# Patient Record
Sex: Male | Born: 1965 | Race: Black or African American | Hispanic: No | Marital: Single
Health system: Midwestern US, Community
[De-identification: ages and names within clinical notes are randomized; demographics above are authoritative.]

## PROBLEM LIST (undated history)

## (undated) DIAGNOSIS — I429 Cardiomyopathy, unspecified: Secondary | ICD-10-CM

## (undated) DIAGNOSIS — G4730 Sleep apnea, unspecified: Secondary | ICD-10-CM

## (undated) DIAGNOSIS — I482 Chronic atrial fibrillation, unspecified: Secondary | ICD-10-CM

## (undated) DIAGNOSIS — Z95810 Presence of automatic (implantable) cardiac defibrillator: Secondary | ICD-10-CM

## (undated) DIAGNOSIS — Z9119 Patient's noncompliance with other medical treatment and regimen: Secondary | ICD-10-CM

## (undated) DIAGNOSIS — D509 Iron deficiency anemia, unspecified: Secondary | ICD-10-CM

## (undated) DIAGNOSIS — Z91199 Patient's noncompliance with other medical treatment and regimen due to unspecified reason: Secondary | ICD-10-CM

## (undated) DIAGNOSIS — I5022 Chronic systolic (congestive) heart failure: Secondary | ICD-10-CM

## (undated) DIAGNOSIS — K7460 Unspecified cirrhosis of liver: Secondary | ICD-10-CM

## (undated) DIAGNOSIS — I10 Essential (primary) hypertension: Secondary | ICD-10-CM

## (undated) DIAGNOSIS — I219 Acute myocardial infarction, unspecified: Secondary | ICD-10-CM

## (undated) DIAGNOSIS — I2510 Atherosclerotic heart disease of native coronary artery without angina pectoris: Secondary | ICD-10-CM

## (undated) HISTORY — PX: TONSILLECTOMY: SUR1361

## (undated) HISTORY — PX: EP IMPLANTABLE DEVICE: SHX172B

---

## 2011-11-27 NOTE — ED Notes (Signed)
Pt brought to room by triage nurse

## 2011-11-28 LAB — METABOLIC PANEL, COMPREHENSIVE
A-G Ratio: 0.9 (ref 0.8–1.7)
ALT (SGPT): 30 U/L (ref 12.0–78.0)
AST (SGOT): 40 U/L — ABNORMAL HIGH (ref 15–37)
Albumin: 3.5 g/dL (ref 3.4–5.0)
Alk. phosphatase: 74 U/L (ref 50–136)
Anion gap: 10 mmol/L (ref 3.0–18)
BUN/Creatinine ratio: 14 (ref 12–20)
BUN: 18 MG/DL (ref 7.0–18)
Bilirubin, total: 2.4 MG/DL — ABNORMAL HIGH (ref 0.2–1.0)
CO2: 28 MMOL/L (ref 21–32)
Calcium: 8.5 MG/DL (ref 8.5–10.1)
Chloride: 104 MMOL/L (ref 100–108)
Creatinine: 1.3 MG/DL (ref 0.6–1.3)
GFR est AA: >60 mL/min/{1.73_m2} (ref >60)
GFR est non-AA: >60 mL/min/{1.73_m2} (ref >60)
Globulin: 3.7 g/dL (ref 2.0–4.0)
Glucose: 89 MG/DL (ref 74–99)
Potassium: 2.9 MMOL/L — CL (ref 3.5–5.5)
Protein, total: 7.2 g/dL (ref 6.4–8.2)
Sodium: 142 MMOL/L (ref 136–145)

## 2011-11-28 LAB — ETHYL ALCOHOL: ALCOHOL(ETHYL),SERUM: 3 MG/DL (ref 0–3)

## 2011-11-28 LAB — CBC WITH AUTOMATED DIFF
ABS. BASOPHILS: 0.1 10*3/uL — ABNORMAL HIGH (ref 0.0–0.06)
ABS. EOSINOPHILS: 0.1 10*3/uL (ref 0.0–0.4)
ABS. LYMPHOCYTES: 2.3 10*3/uL (ref 0.9–3.6)
ABS. MONOCYTES: 0.8 10*3/uL (ref 0.05–1.2)
ABS. NEUTROPHILS: 4.7 10*3/uL (ref 1.8–8.0)
BASOPHILS: 1 % (ref 0–2)
EOSINOPHILS: 1 % (ref 0–5)
HCT: 35.3 % — ABNORMAL LOW (ref 36.0–48.0)
HGB: 11.2 g/dL — ABNORMAL LOW (ref 13.0–16.0)
LYMPHOCYTES: 29 % (ref 21–52)
MCH: 26.8 PG (ref 24.0–34.0)
MCHC: 31.7 g/dL (ref 31.0–37.0)
MCV: 84.4 FL (ref 74.0–97.0)
MONOCYTES: 9 % (ref 3–10)
MPV: 9.9 FL (ref 9.2–11.8)
NEUTROPHILS: 60 % (ref 40–73)
PLATELET: 252 10*3/uL (ref 135–420)
RBC: 4.18 M/uL — ABNORMAL LOW (ref 4.70–5.50)
RDW: 16.9 % — ABNORMAL HIGH (ref 11.6–14.5)
WBC: 8 10*3/uL (ref 4.6–13.2)

## 2011-11-28 LAB — NT-PRO BNP: NT pro-BNP: 7047 PG/ML — ABNORMAL HIGH (ref 0–450)

## 2011-11-28 LAB — DRUG SCREEN, URINE (HBV ONLY)
AMPHETAMINES: NEGATIVE
BARBITURATES: NEGATIVE
BENZODIAZEPINES: NEGATIVE
COCAINE: NEGATIVE
OPIATES: NEGATIVE
PCP(PHENCYCLIDINE): NEGATIVE
THC (TH-CANNABINOL): POSITIVE — AB
TRICYCLICS: NEGATIVE

## 2011-11-28 LAB — EKG, 12 LEAD, INITIAL
Calculated R Axis: 14 degrees
Calculated T Axis: -119 degrees
Q-T Interval: 364 ms
QRS Duration: 136 ms
QTC Calculation (Bezet): 450 ms
Ventricular Rate: 92 {beats}/min

## 2011-11-28 LAB — CARDIAC PANEL,(CK, CKMB & TROPONIN)
CK - MB: 1.5 ng/ml (ref 0.5–3.6)
CK-MB Index: 0.3 % (ref 0.0–4.0)
CK: 519 U/L — ABNORMAL HIGH (ref 39–308)
Troponin-I, QT: 0.07 NG/ML — ABNORMAL HIGH (ref 0.00–0.06)

## 2011-11-28 LAB — DIGOXIN: Digoxin level: 0.2 NG/ML — ABNORMAL LOW (ref 0.9–2.0)

## 2011-11-28 LAB — MAGNESIUM: Magnesium: 1.9 MG/DL (ref 1.8–2.4)

## 2011-11-28 MED ORDER — CLONIDINE 0.1 MG TAB
0.1 mg | ORAL | Status: AC
Start: 2011-11-28 — End: 2011-11-28
  Administered 2011-11-28: 08:00:00 via ORAL

## 2011-11-28 MED ORDER — DIGOXIN 250 MCG/ML IJ SOLN
250 mcg/mL (0.25 mg/mL) | INTRAMUSCULAR | Status: AC
Start: 2011-11-28 — End: 2011-11-28
  Administered 2011-11-28: 08:00:00 via INTRAVENOUS

## 2011-11-28 MED ORDER — POTASSIUM CHLORIDE SR 20 MEQ TAB, PARTICLES/CRYSTALS
20 mEq | ORAL | Status: AC
Start: 2011-11-28 — End: 2011-11-28
  Administered 2011-11-28: 08:00:00 via ORAL

## 2011-11-28 MED ORDER — MAGNESIUM SULFATE 50 % (4 MEQ/ML) INJECTION
4 mEq/mL (50 %) | INTRAMUSCULAR | Status: AC
Start: 2011-11-28 — End: 2011-11-28
  Administered 2011-11-28: 08:00:00 via INTRAVENOUS

## 2011-11-28 MED FILL — MAGNESIUM SULFATE 50 % (4 MEQ/ML) INJECTION: 4 mEq/mL (50 %) | INTRAMUSCULAR | Qty: 4

## 2011-11-28 MED FILL — CLONIDINE 0.1 MG TAB: 0.1 mg | ORAL | Qty: 2

## 2011-11-28 MED FILL — POTASSIUM CHLORIDE SR 20 MEQ TAB, PARTICLES/CRYSTALS: 20 mEq | ORAL | Qty: 2

## 2011-11-28 MED FILL — DIGOXIN 250 MCG/ML IJ SOLN: 250 mcg/mL (0.25 mg/mL) | INTRAMUSCULAR | Qty: 2

## 2011-11-28 NOTE — ED Notes (Signed)
Potassium 2.9; Dr Clinton Sawyer notified

## 2011-11-28 NOTE — ED Notes (Signed)
[employer] are coming out to interrogate the pacemaker

## 2011-11-28 NOTE — ED Notes (Signed)
Pt is from out of town and is visiting family when his pacemaker went off. Pt states he had smoked some weed and drank some alcohol

## 2011-11-28 NOTE — ED Notes (Signed)
I have reviewed discharge instructions with the patient.  The patient verbalized understanding.  Patient armband removed and shredded

## 2011-11-28 NOTE — ED Notes (Signed)
Report to Kelly

## 2011-11-28 NOTE — ED Notes (Signed)
[employer] is at bedside

## 2011-11-28 NOTE — ED Provider Notes (Addendum)
HPI Comments: Lance Lawrence is a 46 y.o. male with Hx of hyperchloremia, HTN, heart failure and CAD presents to the ED for evaluation. Patient notes that he was with a friend smoking weed and drinking alcohol then went to lay down.  Patient states that it then felt like something "suddenly hit me and it hurt form my chest up into my head."  Patient admits that he has a cough currently as well.  His cardiologist is Dr. Cliffton Asters in [place].  Patient is unsure of if his pacemaker "fired."  No further symptoms or complaints were expressed at this time.      Patient is a 46 y.o. male presenting with chest pain and pacemaker problem. The history is provided by the patient.   Chest Pain (Angina)   Pertinent negatives include no abdominal pain, no back pain, no cough, no diaphoresis, no fever, no headaches, no nausea, no numbness, no shortness of breath, no vomiting and no weakness.   Pacemaker Problem   Pertinent negatives include no abdominal pain, no back pain, no cough, no diaphoresis, no fever, no headaches, no nausea, no numbness, no shortness of breath, no vomiting and no weakness.        Past Medical History   Diagnosis Date   ??? CAD (coronary artery disease)    ??? Heart failure    ??? Hypertension    ??? Hyperchloremia         Past Surgical History   Procedure Date   ??? Hx pacemaker          No family history on file.     History     Social History   ??? Marital Status: SINGLE     Spouse Name: N/A     Number of Children: N/A   ??? Years of Education: N/A     Occupational History   ??? Not on file.     Social History Main Topics   ??? Smoking status: Not on file   ??? Smokeless tobacco: Not on file   ??? Alcohol Use: Yes   ??? Drug Use: Yes   ??? Sexually Active:      Other Topics Concern   ??? Not on file     Social History Narrative   ??? No narrative on file                  ALLERGIES: Review of patient's allergies indicates no known allergies.      Review of Systems   Constitutional: Negative.  Negative for fever, chills and  diaphoresis.   HENT: Negative.  Negative for congestion, sore throat, rhinorrhea and neck pain.    Eyes: Negative.  Negative for pain, discharge and redness.   Respiratory: Negative.  Negative for cough, chest tightness, shortness of breath and wheezing.    Cardiovascular: Positive for chest pain.   Gastrointestinal: Negative.  Negative for nausea, vomiting, abdominal pain, diarrhea and constipation.   Genitourinary: Negative.  Negative for dysuria, urgency, frequency, hematuria and flank pain.   Musculoskeletal: Negative.  Negative for back pain.   Skin: Negative.  Negative for rash.   Neurological: Negative.  Negative for syncope, weakness, numbness and headaches.   Hematological: Negative.    Psychiatric/Behavioral: Negative.    All other systems reviewed and are negative.        Filed Vitals:    11/28/11 0006 11/28/11 0015   BP:  141/81   Pulse:  79   Temp: 99.5 ??F (37.5 ??C)  Resp:  32   Height: 5\' 11"  (1.803 m)    Weight: 97.523 kg (215 lb)    SpO2: 100% 92%            Physical Exam   Nursing note and vitals reviewed.  Constitutional: He is oriented to person, place, and time. He appears well-developed and well-nourished.   HENT:   Head: Normocephalic and atraumatic.   Eyes: Conjunctivae and EOM are normal. Pupils are equal, round, and reactive to light. Right eye exhibits no discharge. Left eye exhibits no discharge. No scleral icterus.   Neck: Normal range of motion. Neck supple. No JVD present. No tracheal deviation present. No thyromegaly present.   Cardiovascular: Normal rate, regular rhythm and normal heart sounds.  Exam reveals no gallop and no friction rub.    No murmur heard.  Pulmonary/Chest: Effort normal and breath sounds normal. No stridor. No respiratory distress. He has no wheezes. He has no rales. He exhibits no tenderness.   Abdominal: Soft. Bowel sounds are normal. He exhibits no distension and no mass. There is no tenderness. There is no rebound and no guarding.   Musculoskeletal: Normal  range of motion. He exhibits no edema and no tenderness.   Lymphadenopathy:     He has no cervical adenopathy.   Neurological: He is alert and oriented to person, place, and time. He displays normal reflexes. No cranial nerve deficit. He exhibits normal muscle tone. Coordination normal.   Skin: Skin is warm and dry. No rash noted. No erythema. No pallor.   Psychiatric: He has a normal mood and affect. His behavior is normal. Judgment and thought content normal.        MDM     Differential Diagnosis; Clinical Impression; Plan:     Pt has extensive cardiac disease, states he is compliant with meds however digoxin level is not therapeutic, I got pacemaker interrogated, it showed increase rate but no definitve VT or VF, had the interrogator discuss this with cardiology pa who suggested adjusting rate, and discharge home. I will replace the electrolytes and give digoxin plus antihypertensive medication. Pt is in no distress. Will discharge to [facility] until patient goes back home.       Procedures    CONSULTATIONS:  12:38 AM: Discussed care with [employer].  Standard discussion; including history of patient???s chief complaint, available diagnostic results, and treatment course. Patient is not in their system.  1:16 AM: Patient is under [employer]'s system.       SCRIBE ATTESTATION STATEMENT:  Provider documentation is written by Marnette Burgess, acting as a scribe for Jerre Simon, MD.  I have reviewed the information recorded by the scribe and agree with its contents: Jerre Simon, MD.

## 2011-11-28 NOTE — ED Notes (Signed)
Pt resting comfortably. Informed about [employer] coming to interrogate ICD

## 2013-11-08 NOTE — ED Provider Notes (Signed)
HPI Comments: Lance RamsayDavid Lawrence is a 48 y.o. male with a Hx of HTN, CAD, and CHF presents to the ED with complaints of acute on chronic dyspnea, lower extremity swelling, abd distension, and substernal CP described as a pressure. Pt is a rambling historian. Pt has a family member admitted to [facility], and he was asleep in the waiting room when he had sudden onset of acute dyspnea and substernal CP. He reports being seen at [facility] for dyspnea yesterday and discharged. He has not taken prescribed medications in 2 days. He has a pacemaker that he says was placed by [facility] in [place], [place]. He does not know the results of stress tests performed, and says a cardiac catheterization showed "a little blockage???they didn't say anything about stents." Pt also notes a cough for the past few days. Pt made no further complaints.    The history is provided by the patient and medical records. No language interpreter was used.        Past Medical History   Diagnosis Date   ??? CAD (coronary artery disease)    ??? Heart failure    ??? Hypertension    ??? Hyperchloremia         Past Surgical History   Procedure Laterality Date   ??? Hx pacemaker           History reviewed. No pertinent family history.     History     Social History   ??? Marital Status: SINGLE     Spouse Name: N/A     Number of Children: N/A   ??? Years of Education: N/A     Occupational History   ??? Not on file.     Social History Main Topics   ??? Smoking status: Not on file   ??? Smokeless tobacco: Not on file   ??? Alcohol Use: Yes   ??? Drug Use: Yes   ??? Sexual Activity: Not on file     Other Topics Concern   ??? Not on file     Social History Narrative                  ALLERGIES: Review of patient's allergies indicates no known allergies.      Review of Systems   Constitutional: Negative.    HENT: Negative.    Eyes: Negative.    Respiratory: Positive for cough and shortness of breath.    Cardiovascular: Positive for chest pain and leg swelling.   Gastrointestinal: Positive for  abdominal distention.   Endocrine: Negative.    Genitourinary: Negative.    Musculoskeletal: Negative.    Skin: Negative.    Allergic/Immunologic: Negative.    Neurological: Negative.    Hematological: Negative.    Psychiatric/Behavioral: Negative.    All other systems reviewed and are negative.      Filed Vitals:    11/08/13 2230 11/09/13 0015 11/09/13 0100 11/09/13 0215   BP: 140/97 130/89 126/91 114/75   Pulse: 89 87 82 76   Resp: 24 21 18 18    SpO2: 95% 94% 95% 96%            Physical Exam   Constitutional: He is oriented to person, place, and time. He appears well-developed and well-nourished. No distress.   HENT:   Head: Normocephalic and atraumatic.   Right Ear: External ear normal.   Left Ear: External ear normal.   Nose: Nose normal.   Mouth/Throat: Oropharynx is clear and moist.   Eyes: Conjunctivae and EOM  are normal. Pupils are equal, round, and reactive to light. Scleral icterus is present.   Neck: Normal range of motion. Neck supple. JVD present. No tracheal deviation present. No thyromegaly present.   JVD to the angle of the jaw at 45??.    Cardiovascular: Normal heart sounds and intact distal pulses.  Exam reveals no gallop and no friction rub.    No murmur heard.  Irregularly irregular.    Pulmonary/Chest: He has wheezes. He exhibits no tenderness.   Heartbeat felt at the 6th intercostal at the mid-axillary line. Trace wheezing and tachypneic.    Abdominal: Soft. Bowel sounds are normal. He exhibits no distension. There is no tenderness. There is no rebound and no guarding.   Musculoskeletal: Normal range of motion. He exhibits edema. He exhibits no tenderness.   3+ lower extremity edema up to the pre-sacrum.    Lymphadenopathy:     He has no cervical adenopathy.   Neurological: He is alert and oriented to person, place, and time. He has normal reflexes. No cranial nerve deficit. Coordination normal.   No sensory loss, Gait normal, Motor 5/5   Skin: Skin is warm and dry.   Psychiatric: He has a  normal mood and affect. His behavior is normal. Judgment and thought content normal.   Nursing note and vitals reviewed.       MDM  Number of Diagnoses or Management Options  Diagnosis management comments: Shortness of breath noted. DDx CHF, COPD, clot, infection, hemorrhage, other etiologies.  Chest pain, differential to include coronary artery disease related,pericardial disease, vascular disease, PE, esophageal or gastric conditions, gallbladder disease,musculoskeletal abnormalities,other possible etiologies.  History of cardiomyopathy with a defibrillator placed at [facility] in [place]. States he also had a cardiac cath there showing a coronary blockage Will attempt to obtain the old records.   Reviewed [facility] records, results as followsDiagnosis Date   ??? Ischemic cardiomyopathy 08/14/04   Ef=30%   ??? HLD (hyperlipidemia)   ??? Tobacco abuse   ??? PUD (peptic ulcer disease)   ??? Marijuana abuse   ??? Noncompliance with medication regimen   ??? Alcohol abuse   ??? CAD (coronary artery disease) 08/14/04   Angioplasty x 1 vessel 11/05, Cath [id number] 20% LCx & RCA   ??? Hypertension   ??? Systolic and diastolic CHF, acute on chronic   June 2012 EF = 15% by echo   ??? Pacemaker 2011   ??? Congestive heart failure, unspecified   CHF book given 11/18/2012, pt has scales 05/25/2013 Digital scales provided and usage reviewed. Provided CHF zones and weight logs   ??? Atrial fibrillation   ??? Atrial thrombus   ??? Cirrhosis of liver   ??? Cannabinosis     Past Surgical History   Procedure Laterality Date   ??? Sur - tonsillectomy   ??? Cpt - left heart catheterization,retrograde,brachial/axillary/femoral artery;percutaneous 08/14/04   PTCA left circumflex of OM3   ??? Biventricular icd   ??? Biventricular pacemaker     History     Social History   ??? Marital Status: Legally Separated   Spouse Name: N/A   Number of Children: 4   ??? Years of Education: N/A     Occupational History   ??? Not on file.     Social History Main Topics   ??? Smoking status:  Current Every Day Smoker -- 0.50 packs/day for 30 years   Types: Cigarettes   ??? Smokeless tobacco: Never Used   ??? Alcohol Use: No   ???  Drug Use: 6.00 per week   Special: Smoke   Comment: marijuana   ??? Sexual Activity: Yes   Partners: Female     Other Topics Concern   ??? Hearing Deficit No   ??? Communication Concerns No   ??? Mobility Deficits No   ??? Vision Deficits No   ??? Memory Deficits No   ??? Self Care Deficits No   ??? Swallowing Deficits No   ??? Military Service No   ??? Seat Belt Yes   ??? Stress Concerns Yes   ??? Weight Concerns No   ??? Blood Transfusions No   ??? Equipment/Assistive Technology No     Social History Narrative   ??? No narrative on file     Family History   Problem Relation Age of Onset   ??? CAD Father   ??? Diabetes Father   ??? Diabetes Mother   ??? Diabetes Brother   ??? Heart Brother   ??? Hypertension Brother   ??? Diabetes Sister   ??? Heart Brother   ??? Hypertension Brother          Amount and/or Complexity of Data Reviewed  Clinical lab tests: ordered and reviewed  Tests in the radiology section of CPT??: ordered        Procedures    Patient Vitals for the past 12 hrs:   Pulse Resp BP SpO2   11/09/13 0215 76 18 114/75 mmHg 96 %   11/09/13 0100 82 18 126/91 mmHg 95 %   11/09/13 0015 87 21 130/89 mmHg 94 %   11/08/13 2230 89 24 140/97 mmHg 95 %   11/08/13 2152 75 22 131/97 mmHg 98 %         Medications ordered:   Medications   atorvastatin (LIPITOR) tablet 10 mg (10 mg Oral Given 11/09/13 0009)   albuterol-ipratropium (DUO-NEB) 2.5 MG-0.5 MG/3 ML (3 mL Nebulization Given 11/08/13 2222)   furosemide (LASIX) injection 40 mg (40 mg IntraVENous Given 11/09/13 0011)   carvedilol (COREG) tablet 6.25 mg (6.25 mg Oral Given 11/09/13 0011)   rivaroxaban (XARELTO) tablet 20 mg (20 mg Oral Given 11/09/13 0113)         Lab findings:  Recent Results (from the past 12 hour(s))   METABOLIC PANEL, BASIC    Collection Time     11/08/13  9:51 PM       Result Value Ref Range    Sodium 140  136 - 145 mmol/L    Potassium 3.3 (*) 3.5 - 5.5 mmol/L     Chloride 106  100 - 108 mmol/L    CO2 27  21 - 32 mmol/L    Anion gap 7  3.0 - 18 mmol/L    Glucose 87  74 - 99 mg/dL    BUN 14  7.0 - 18 MG/DL    Creatinine 1.61  0.6 - 1.3 MG/DL    BUN/Creatinine ratio 13  12 - 20      GFR est AA >60  >60 ml/min/1.57m2    GFR est non-AA >60  >60 ml/min/1.92m2    Calcium 8.7  8.5 - 10.1 MG/DL   CARDIAC PANEL,(CK, CKMB & TROPONIN)    Collection Time     11/08/13  9:51 PM       Result Value Ref Range    CK 139  39 - 308 U/L    CK - MB 0.7  0.5 - 3.6 ng/ml    CK-MB Index 0.5  0.0 - 4.0 %  Troponin-I, Qt. 0.02  0.0 - 0.045 NG/ML   PRO-BNP    Collection Time     11/08/13  9:51 PM       Result Value Ref Range    NT pro-BNP 5895 (*) 0 - 450 PG/ML   DIGOXIN    Collection Time     11/08/13  9:55 PM       Result Value Ref Range    DIGOXIN 0.3 (*) 0.9 - 2.0 NG/ML   CARDIAC PANEL,(CK, CKMB & TROPONIN)    Collection Time     11/09/13  1:54 AM       Result Value Ref Range    CK 136  39 - 308 U/L    CK - MB 1.0  0.5 - 3.6 ng/ml    CK-MB Index 0.7  0.0 - 4.0 %    Troponin-I, Qt. 0.02  0.0 - 0.045 NG/ML     EKG interpretation: EKG read at 2148: SR at 85 bpm. Demand pacemaker. Afib with PVC's or aberrantly conducted complexes. Low voltage QRS. Incomplete LBBB. ST and T wave abnormality. Prolonged QT. No STEMI.    X-Ray, CT or other radiology findings or impressions:  XR CHEST PA LAT    (Results Pending)   CXR: Cardiomegaly and defibrillator. 11:41 PM    Progress and Consult notes:    2:48 AM, 11/08/2013   Progress Note: Repeat cardiac enzyme panel is negative. Pt???s memory is poor but had ischemic cardiomyopathy in the past and had a stent placed.    3:10 AM, 11/08/2013   Progress Note: Pt received medication here, and he says he will drive home to retrieve his medications tomorrow. Pt says that he is feeling better and I am going to proceed with discharge at this time.        Diagnosis:   1. CHF (congestive heart failure)    2. Chest pain      Disposition: Discharged.    Follow-up Information     Follow up With Details Comments Contact Info    Nathaniel Man, MD In 2 days  [address]  [address]  [place] [address]  [phone]      [facility] EMERGENCY DEPT  As needed, If symptoms worsen [address]  [place] [address]  [phone]          Scribe Attestation:   November 08, 2013 at 11:40 PM Aaron Edelman scribing for and in the presence of Dr.Haddon Fyfe Nena Alexander, MD     Aaron Edelman, Scribe      Provider Attestation:   I personally performed the services described in the documentation, reviewed the documentation, as recorded by the scribe in my presence, and it accurately and completely records my words and actions. November 09, 2013 at 7:06 AM - Thomes Dinning, MD    (PROVIDER ATTESTATION)

## 2013-11-08 NOTE — ED Notes (Signed)
Provided pt with warm blanket upon request, call bell is within reach, v/s/s on cardiac monitor.

## 2013-11-08 NOTE — ED Notes (Addendum)
Pt complains of waking up from a nap this evening feeling SOB and then having onset of CP. Auditory wheezes and bilateral lower extremity swelling noted.

## 2013-11-09 LAB — EKG, 12 LEAD, INITIAL
Calculated R Axis: 29 degrees
Calculated T Axis: -113 degrees
Q-T Interval: 390 ms
QRS Duration: 116 ms
QTC Calculation (Bezet): 464 ms
Ventricular Rate: 85 {beats}/min

## 2013-11-09 LAB — METABOLIC PANEL, BASIC
Anion gap: 7 mmol/L (ref 3.0–18)
BUN/Creatinine ratio: 13 (ref 12–20)
BUN: 14 MG/DL (ref 7.0–18)
CO2: 27 mmol/L (ref 21–32)
Calcium: 8.7 MG/DL (ref 8.5–10.1)
Chloride: 106 mmol/L (ref 100–108)
Creatinine: 1.1 MG/DL (ref 0.6–1.3)
GFR est AA: >60 mL/min/{1.73_m2} (ref >60)
GFR est non-AA: >60 mL/min/{1.73_m2} (ref >60)
Glucose: 87 mg/dL (ref 74–99)
Potassium: 3.3 mmol/L — ABNORMAL LOW (ref 3.5–5.5)
Sodium: 140 mmol/L (ref 136–145)

## 2013-11-09 LAB — CARDIAC PANEL,(CK, CKMB & TROPONIN)
CK - MB: 0.7 ng/ml (ref 0.5–3.6)
CK - MB: 1 ng/ml (ref 0.5–3.6)
CK-MB Index: 0.5 % (ref 0.0–4.0)
CK-MB Index: 0.7 % (ref 0.0–4.0)
CK: 139 U/L (ref 39–308)
CK: 136 U/L (ref 39–308)
Troponin-I, QT: 0.02 NG/ML (ref 0.0–0.045)
Troponin-I, QT: 0.02 NG/ML (ref 0.0–0.045)

## 2013-11-09 LAB — NT-PRO BNP: NT pro-BNP: 5895 PG/ML — ABNORMAL HIGH (ref 0–450)

## 2013-11-09 LAB — DIGOXIN: Digoxin level: 0.3 NG/ML — ABNORMAL LOW (ref 0.9–2.0)

## 2013-11-09 MED ORDER — CARVEDILOL 6.25 MG TAB
6.25 mg | ORAL | Status: AC
Start: 2013-11-09 — End: 2013-11-09
  Administered 2013-11-09: 05:00:00 via ORAL

## 2013-11-09 MED ORDER — IPRATROPIUM-ALBUTEROL 2.5 MG-0.5 MG/3 ML NEB SOLUTION
2.5 mg-0.5 mg/3 ml | RESPIRATORY_TRACT | Status: AC
Start: 2013-11-09 — End: 2013-11-08
  Administered 2013-11-09: 03:00:00 via RESPIRATORY_TRACT

## 2013-11-09 MED ORDER — FUROSEMIDE 10 MG/ML IJ SOLN
10 mg/mL | INTRAMUSCULAR | Status: AC
Start: 2013-11-09 — End: 2013-11-09
  Administered 2013-11-09: 05:00:00 via INTRAVENOUS

## 2013-11-09 MED ORDER — ATORVASTATIN 20 MG TAB
20 mg | Freq: Every evening | ORAL | Status: DC
Start: 2013-11-09 — End: 2013-11-09
  Administered 2013-11-09: 05:00:00 via ORAL

## 2013-11-09 MED ADMIN — rivaroxaban (XARELTO) tablet 20 mg: ORAL | @ 06:00:00 | NDC 50458057930

## 2013-11-09 MED FILL — XARELTO 20 MG TABLET: 20 mg | ORAL | Qty: 1

## 2013-11-09 MED FILL — LIPITOR 20 MG TABLET: 20 mg | ORAL | Qty: 1

## 2013-11-09 MED FILL — FUROSEMIDE 10 MG/ML IJ SOLN: 10 mg/mL | INTRAMUSCULAR | Qty: 4

## 2013-11-09 MED FILL — CARVEDILOL 6.25 MG TAB: 6.25 mg | ORAL | Qty: 1

## 2013-11-09 MED FILL — IPRATROPIUM-ALBUTEROL 2.5 MG-0.5 MG/3 ML NEB SOLUTION: 2.5 mg-0.5 mg/3 ml | RESPIRATORY_TRACT | Qty: 3

## 2013-11-09 NOTE — ED Notes (Signed)
Pt resting in POC, watching TV and call bell within reach. Pt informed of plan to repeat cardiac labs at 2am and agrees to plan. Pt denies having any further complaints at this time.

## 2013-11-09 NOTE — ED Notes (Signed)
I have reviewed discharge instructions with the patient.  The patient verbalized understanding. Patient armband removed and given to patient to take home.  Patient was informed of the privacy risks if armband lost or stolen

## 2013-11-09 NOTE — ED Notes (Signed)
Pt requesting orange juice, iced water provided. Pt verbalizes no further complaints at this time.

## 2013-11-09 NOTE — ED Notes (Signed)
Pt continues to rest in POC, no acute changes in vital signs, will continue to monitor while waiting for disposition.

## 2014-04-20 ENCOUNTER — Observation Stay (HOSPITAL_COMMUNITY)
Admission: EM | Admit: 2014-04-20 | Discharge: 2014-04-21 | Disposition: A | Discharge status: Home / Self Care | Payer: Medicare Other | Attending: Internal Medicine | Admitting: Internal Medicine

## 2014-04-20 ENCOUNTER — Emergency Department (HOSPITAL_COMMUNITY): Payer: Medicare Other

## 2014-04-20 ENCOUNTER — Encounter (HOSPITAL_COMMUNITY): Payer: Self-pay | Admitting: Emergency Medicine

## 2014-04-20 DIAGNOSIS — I2589 Other forms of chronic ischemic heart disease: Secondary | ICD-10-CM | POA: Insufficient documentation

## 2014-04-20 DIAGNOSIS — I5022 Chronic systolic (congestive) heart failure: Secondary | ICD-10-CM | POA: Diagnosis present

## 2014-04-20 DIAGNOSIS — I10 Essential (primary) hypertension: Secondary | ICD-10-CM | POA: Diagnosis not present

## 2014-04-20 DIAGNOSIS — Z885 Allergy status to narcotic agent status: Secondary | ICD-10-CM | POA: Diagnosis not present

## 2014-04-20 DIAGNOSIS — Z7901 Long term (current) use of anticoagulants: Secondary | ICD-10-CM | POA: Diagnosis not present

## 2014-04-20 DIAGNOSIS — Z720 Tobacco use: Secondary | ICD-10-CM | POA: Diagnosis present

## 2014-04-20 DIAGNOSIS — R110 Nausea: Secondary | ICD-10-CM | POA: Diagnosis not present

## 2014-04-20 DIAGNOSIS — R0602 Shortness of breath: Secondary | ICD-10-CM | POA: Diagnosis not present

## 2014-04-20 DIAGNOSIS — Z95810 Presence of automatic (implantable) cardiac defibrillator: Secondary | ICD-10-CM | POA: Diagnosis present

## 2014-04-20 DIAGNOSIS — R0789 Other chest pain: Secondary | ICD-10-CM | POA: Diagnosis not present

## 2014-04-20 DIAGNOSIS — I499 Cardiac arrhythmia, unspecified: Secondary | ICD-10-CM | POA: Diagnosis not present

## 2014-04-20 DIAGNOSIS — J40 Bronchitis, not specified as acute or chronic: Secondary | ICD-10-CM | POA: Diagnosis not present

## 2014-04-20 DIAGNOSIS — R079 Chest pain, unspecified: Secondary | ICD-10-CM | POA: Diagnosis present

## 2014-04-20 DIAGNOSIS — I25119 Atherosclerotic heart disease of native coronary artery with unspecified angina pectoris: Secondary | ICD-10-CM

## 2014-04-20 DIAGNOSIS — I252 Old myocardial infarction: Secondary | ICD-10-CM | POA: Diagnosis not present

## 2014-04-20 DIAGNOSIS — K7460 Unspecified cirrhosis of liver: Secondary | ICD-10-CM | POA: Diagnosis present

## 2014-04-20 DIAGNOSIS — Z9119 Patient's noncompliance with other medical treatment and regimen: Secondary | ICD-10-CM | POA: Insufficient documentation

## 2014-04-20 DIAGNOSIS — Z91199 Patient's noncompliance with other medical treatment and regimen due to unspecified reason: Secondary | ICD-10-CM

## 2014-04-20 DIAGNOSIS — I482 Chronic atrial fibrillation, unspecified: Secondary | ICD-10-CM | POA: Diagnosis present

## 2014-04-20 DIAGNOSIS — E785 Hyperlipidemia, unspecified: Secondary | ICD-10-CM | POA: Diagnosis not present

## 2014-04-20 DIAGNOSIS — I4891 Unspecified atrial fibrillation: Secondary | ICD-10-CM | POA: Insufficient documentation

## 2014-04-20 DIAGNOSIS — I2510 Atherosclerotic heart disease of native coronary artery without angina pectoris: Secondary | ICD-10-CM | POA: Diagnosis not present

## 2014-04-20 DIAGNOSIS — Z9114 Patient's other noncompliance with medication regimen: Secondary | ICD-10-CM

## 2014-04-20 DIAGNOSIS — I509 Heart failure, unspecified: Secondary | ICD-10-CM | POA: Diagnosis not present

## 2014-04-20 DIAGNOSIS — F17200 Nicotine dependence, unspecified, uncomplicated: Secondary | ICD-10-CM | POA: Insufficient documentation

## 2014-04-20 DIAGNOSIS — Z79899 Other long term (current) drug therapy: Secondary | ICD-10-CM | POA: Diagnosis not present

## 2014-04-20 DIAGNOSIS — I420 Dilated cardiomyopathy: Secondary | ICD-10-CM | POA: Diagnosis present

## 2014-04-20 DIAGNOSIS — Z91148 Patient's other noncompliance with medication regimen for other reason: Secondary | ICD-10-CM

## 2014-04-20 DIAGNOSIS — I209 Angina pectoris, unspecified: Secondary | ICD-10-CM | POA: Diagnosis not present

## 2014-04-20 HISTORY — DX: Sleep apnea, unspecified: G47.30

## 2014-04-20 HISTORY — DX: Acute myocardial infarction, unspecified: I21.9

## 2014-04-20 LAB — COMPREHENSIVE METABOLIC PANEL
ALK PHOS: 75 U/L (ref 39–117)
ALT: 9 U/L (ref 0–53)
AST: 21 U/L (ref 0–37)
Albumin: 3.4 g/dL — ABNORMAL LOW (ref 3.5–5.2)
Anion gap: 14 (ref 5–15)
BUN: 17 mg/dL (ref 6–23)
CO2: 25 mEq/L (ref 19–32)
Calcium: 8.9 mg/dL (ref 8.4–10.5)
Chloride: 104 mEq/L (ref 96–112)
Creatinine, Ser: 1.13 mg/dL (ref 0.50–1.35)
GFR, EST AFRICAN AMERICAN: 87 mL/min — AB (ref >90)
GFR, EST NON AFRICAN AMERICAN: 75 mL/min — AB (ref >90)
GLUCOSE: 87 mg/dL (ref 70–99)
POTASSIUM: 3.3 meq/L — AB (ref 3.7–5.3)
Sodium: 143 mEq/L (ref 137–147)
Total Bilirubin: 2.2 mg/dL — ABNORMAL HIGH (ref 0.3–1.2)
Total Protein: 6.9 g/dL (ref 6.0–8.3)

## 2014-04-20 LAB — I-STAT TROPONIN, ED: Troponin i, poc: 0.02 ng/mL (ref 0.00–0.08)

## 2014-04-20 LAB — PRO B NATRIURETIC PEPTIDE: Pro B Natriuretic peptide (BNP): 2601 pg/mL — ABNORMAL HIGH (ref 0–125)

## 2014-04-20 LAB — CBC
HEMATOCRIT: 29.1 % — AB (ref 39.0–52.0)
Hemoglobin: 8.9 g/dL — ABNORMAL LOW (ref 13.0–17.0)
MCH: 22.6 pg — ABNORMAL LOW (ref 26.0–34.0)
MCHC: 30.6 g/dL (ref 30.0–36.0)
MCV: 73.9 fL — ABNORMAL LOW (ref 78.0–100.0)
PLATELETS: 279 10*3/uL (ref 150–400)
RBC: 3.94 MIL/uL — AB (ref 4.22–5.81)
RDW: 19.4 % — ABNORMAL HIGH (ref 11.5–15.5)
WBC: 4.7 10*3/uL (ref 4.0–10.5)

## 2014-04-20 LAB — PROTIME-INR
INR: 1.69 — AB (ref 0.00–1.49)
Prothrombin Time: 19.9 seconds — ABNORMAL HIGH (ref 11.6–15.2)

## 2014-04-20 MED ORDER — HYDROMORPHONE HCL PF 1 MG/ML IJ SOLN
0.5000 mg | Freq: Once | INTRAMUSCULAR | Status: AC
  Administered 2014-04-20: 0.5 mg via INTRAVENOUS
  Filled 2014-04-20: qty 1

## 2014-04-20 MED ORDER — NITROGLYCERIN 0.4 MG SL SUBL
0.4000 mg | SUBLINGUAL_TABLET | SUBLINGUAL | Status: DC | PRN
  Administered 2014-04-20 (×3): 0.4 mg via SUBLINGUAL
  Filled 2014-04-20: qty 1

## 2014-04-20 MED ORDER — ASPIRIN EC 325 MG PO TBEC
325.0000 mg | DELAYED_RELEASE_TABLET | Freq: Once | ORAL | Status: AC
  Administered 2014-04-20: 325 mg via ORAL
  Filled 2014-04-20: qty 1

## 2014-04-20 NOTE — ED Provider Notes (Signed)
CSN: [id number]     Arrival date & time 04/20/14  2021 History   None    Chief Complaint  Patient presents with  . Chest Pain     (Consider location/radiation/quality/duration/timing/severity/associated sxs/prior Treatment) Patient is a 48 y.o. male presenting with chest pain. The history is provided by the patient.  Chest Pain Pain location:  Substernal area Pain quality: pressure   Pain radiates to:  Does not radiate Pain radiates to the back: no   Pain severity:  Moderate Onset quality:  Gradual Duration:  1 hour Timing:  Constant Progression:  Unchanged Chronicity:  Recurrent Context: at rest   Context: not breathing, no drug use, not eating, no intercourse, not lifting, no movement, not raising an arm, no stress and no trauma   Relieved by:  None tried Worsened by:  Nothing tried Ineffective treatments:  None tried Associated symptoms: nausea and shortness of breath   Associated symptoms: no abdominal pain, no AICD problem, no altered mental status, no anorexia, no anxiety, no back pain, no claudication, no cough, no diaphoresis, no dizziness, no dysphagia, no fatigue, no fever, no headache, no heartburn, no lower extremity edema, no near-syncope, no numbness, no orthopnea, no palpitations, no PND, no syncope, not vomiting and no weakness   Risk factors: coronary artery disease, hypertension, male sex and smoking   Risk factors: no aortic disease, no diabetes mellitus, no Ehlers-Danlos syndrome, no high cholesterol, no immobilization, not obese, no prior DVT/PE and no surgery    Pt is a poor historian.  Lives in [place].  Was here visiting a friend and had a CP episode starting 1 hr before arrival.  Has difficulty characterizing his chest pain.  Receives an infusion in his R arm PICC line 3x/wk "to make his heart stronger".  Has a pacemaker, but not sure what it is for.  States "it's because of my heart attack."  States he has had a prior MI, but no stents.  Denies recreational  drug use.  Endorses tobacco use 1ppd, and occasional ETOH.  Past Medical History  Diagnosis Date  . Hypertension   . MI (myocardial infarction) 2007  . Bronchitis   . Pacemaker   . Dysrhythmia   . CHF (congestive heart failure)   . Sleep apnea    Past Surgical History  Procedure Laterality Date  . Tonsillectomy    . Insert / replace / remove pacemaker     Family History  Problem Relation Age of Onset  . Heart disease Father   . Heart disease Sister   . Heart disease Mother    History  Substance Use Topics  . Smoking status: Current Every Day Smoker -- 0.50 packs/day for 15 years    Types: Cigarettes  . Smokeless tobacco: Not on file  . Alcohol Use: Yes     Comment: occasional    Review of Systems  Constitutional: Negative.  Negative for fever, diaphoresis and fatigue.  HENT: Negative.  Negative for trouble swallowing.   Eyes: Negative.   Respiratory: Positive for shortness of breath. Negative for cough.   Cardiovascular: Positive for chest pain. Negative for palpitations, orthopnea, claudication, syncope, PND and near-syncope.  Gastrointestinal: Positive for nausea. Negative for heartburn, vomiting, abdominal pain and anorexia.  Endocrine: Negative.   Genitourinary: Negative.   Musculoskeletal: Negative.  Negative for back pain.  Skin: Negative.   Allergic/Immunologic: Negative.   Neurological: Negative.  Negative for dizziness, weakness, numbness and headaches.  Hematological: Negative.   Psychiatric/Behavioral: Negative.  Allergies  Vicodin  Home Medications   Prior to Admission medications   Medication Sig Start Date End Date Taking? Authorizing Provider  albuterol (PROVENTIL) (2.5 MG/3ML) 0.083% nebulizer solution Take 2.5 mg by nebulization every 6 (six) hours as needed for wheezing or shortness of breath.   Yes Historical Provider, MD  amLODipine (NORVASC) 10 MG tablet Take 10 mg by mouth daily.   Yes Historical Provider, MD   amoxicillin-clavulanate (AUGMENTIN) 500-125 MG per tablet Take 1 tablet by mouth every 8 (eight) hours. Take for 7 days only. Patient is unsure when he started course of medication. Patient has 15 pills in bottle as of 04-20-14   Yes Historical Provider, MD  atorvastatin (LIPITOR) 40 MG tablet Take 40 mg by mouth daily.   Yes Historical Provider, MD  benazepril (LOTENSIN) 40 MG tablet Take 40 mg by mouth daily.   Yes Historical Provider, MD  digoxin (LANOXIN) 0.125 MG tablet Take 0.125 mg by mouth daily.   Yes Historical Provider, MD  doxycycline (VIBRA-TABS) 100 MG tablet Take 100 mg by mouth every 12 (twelve) hours.   Yes Historical Provider, MD  furosemide (LASIX) 40 MG tablet Take 40 mg by mouth.   Yes Historical Provider, MD  isosorbide mononitrate (IMDUR) 30 MG 24 hr tablet Take 30 mg by mouth daily.   Yes Historical Provider, MD  magnesium oxide (MAG-OX) 400 MG tablet Take 400 mg by mouth 2 (two) times daily.   Yes Historical Provider, MD  omeprazole (PRILOSEC) 20 MG capsule Take 20 mg by mouth daily.   Yes Historical Provider, MD  potassium chloride (K-DUR) 10 MEQ tablet Take 10 mEq by mouth 2 (two) times daily.   Yes Historical Provider, MD  rivaroxaban (XARELTO) 20 MG TABS tablet Take 20 mg by mouth daily with supper.   Yes Historical Provider, MD  sertraline (ZOLOFT) 50 MG tablet Take 50 mg by mouth daily.   Yes Historical Provider, MD   BP 118/69  Pulse 84  Temp(Src) 98.7 F (37.1 C) (Oral)  Resp 22  Ht 5\' 11"  (1.803 m)  Wt 215 lb (97.523 kg)  BMI 30.00 kg/m2  SpO2 99% Physical Exam  Nursing note and vitals reviewed. Constitutional: He is oriented to person, place, and time. He appears well-developed and well-nourished. No distress.  HENT:  Head: Normocephalic and atraumatic.  Right Ear: External ear normal.  Left Ear: External ear normal.  Nose: Nose normal.  Mouth/Throat: Oropharynx is clear and moist. No oropharyngeal exudate.  Eyes: Conjunctivae and EOM are normal.  Pupils are equal, round, and reactive to light. Right eye exhibits no discharge. Left eye exhibits no discharge. No scleral icterus.  Neck: Normal range of motion. Neck supple. No JVD present. No tracheal deviation present. No thyromegaly present.  Cardiovascular: Normal rate, regular rhythm, normal heart sounds and intact distal pulses.  Exam reveals no gallop and no friction rub.   No murmur heard. Pulmonary/Chest: Effort normal and breath sounds normal. No stridor. No respiratory distress. He has no wheezes. He has no rales. He exhibits no tenderness.  Abdominal: Soft. Bowel sounds are normal. He exhibits no distension. There is no tenderness. There is no rebound and no guarding.  Musculoskeletal: Normal range of motion. He exhibits no edema and no tenderness.  Lymphadenopathy:    He has no cervical adenopathy.  Neurological: He is alert and oriented to person, place, and time. He is not disoriented. No cranial nerve deficit or sensory deficit. GCS eye subscore is 4. GCS verbal subscore is  5. GCS motor subscore is 6.  Skin: Skin is warm and dry. No rash noted. He is not diaphoretic. No erythema. No pallor.  Psychiatric: He has a normal mood and affect. His speech is normal and behavior is normal. Judgment and thought content normal. Cognition and memory are normal.    ED Course  Procedures (including critical care time) Labs Review Labs Reviewed  COMPREHENSIVE METABOLIC PANEL - Abnormal; Notable for the following:    Potassium 3.3 (*)    Albumin 3.4 (*)    Total Bilirubin 2.2 (*)    GFR calc non Af Amer 75 (*)    GFR calc Af Amer 87 (*)    All other components within normal limits  PRO B NATRIURETIC PEPTIDE - Abnormal; Notable for the following:    Pro B Natriuretic peptide (BNP) 2601.0 (*)    All other components within normal limits  PROTIME-INR - Abnormal; Notable for the following:    Prothrombin Time 19.9 (*)    INR 1.69 (*)    All other components within normal limits  CBC -  Abnormal; Notable for the following:    RBC 3.94 (*)    Hemoglobin 8.9 (*)    HCT 29.1 (*)    MCV 73.9 (*)    MCH 22.6 (*)    RDW 19.4 (*)    All other components within normal limits  TROPONIN I  TROPONIN I  TROPONIN I  Rosezena Sensor, ED    Imaging Review Dg Chest Port 1 View  04/21/2014   CLINICAL DATA:  PICC line insertion.  EXAM: PORTABLE CHEST - 1 VIEW  COMPARISON:  04/20/2014.  FINDINGS: Cardiomegaly. Cardiac pacer noted with lead tips in the right atrium and right ventricle. Pulmonary vascularity normal. PICC line has been advanced. Its tip is at the cavoatrial junction. Lungs are clear. No pleural effusion or pneumothorax.  IMPRESSION: 1. Interim advancement of PICC line. PICC line tip is at the cavoatrial junction. 2. Severe cardiomegaly.  No CHF.  Cardiac pacer in stable position.   Electronically Signed   By: Maisie Fus  Register   On: 04/21/2014 16:18   Dg Chest Portable 1 View  04/20/2014   CLINICAL DATA:  CHEST PAIN picc line placement  EXAM: PORTABLE CHEST - 1 VIEW  COMPARISON:  None.  FINDINGS: Left subclavian AICD. Moderate cardiomegaly. Right arm PICC extends to the level of the right brachiocephalic vein. Lungs clear. No effusion. Regional bones unremarkable.  IMPRESSION: 1. Cardiomegaly. 2. Right arm PICC to right brachiocephalic vein.   Electronically Signed   By: Oley Balm M.D.   On: 04/20/2014 21:17     EKG Interpretation   Date/Time:  Wednesday April 20 2014 20:25:37 EDT Ventricular Rate:  100 PR Interval:    QRS Duration: 119 QT Interval:  381 QTC Calculation: 491 R Axis:   135 Text Interpretation:  Atrial flutter Aberrant conduction of SV complex(es)  Nonspecific intraventricular conduction delay Borderline low voltage,  extremity leads Repol abnrm suggests ischemia, lateral leads Confirmed by  Rhunette Croft, MD, Janey Genta [phone]) on 04/20/2014 10:29:48 PM      MDM   Final diagnoses:  Chronic systolic congestive heart failure, NYHA class 4  Chronic atrial  fibrillation  Coronary artery disease involving native coronary artery of native heart without angina pectoris  Chest pain, unspecified chest pain type  Coronary artery disease with unspecified angina pectoris  Essential hypertension  Non compliance w medication regimen  AICD (automatic cardioverter/defibrillator) present  Tobacco abuse    Will obtain  ACS r/o w/u.  Limited history available from patient.  States he saw his cardiologist Dr. Belva CromeLynn Elie in [place] about a month ago.  He has many high risk features, and history is questionable based on age and numerous cardiac issues.  Suspect cardiomyopathy Hx.  Labs and CXR reviewed.  Troponin is negative, BNP elevated unknown baseline, CXR with cardiomegaly.  Will need admission for further w/u.  Pt admitted to [facility].  Patient care was discussed with my attending, Dr. Rhunette CroftNanavati.    Gavin PoundJustin Mylie Mccurley, MD 04/21/14 [id number]

## 2014-04-20 NOTE — ED Notes (Signed)
Pt states he has a picc line because he gets medicine fro his heart through it three times a week over 4 hours

## 2014-04-20 NOTE — ED Notes (Addendum)
Patient arrives via EMS from family home. Pt c/o chest pain across chest radiating to back and left arm. Denies nausea and vomiting. C/o lightheadedness. Patient has a PICC in Right Upper Arm. Rate pain 10/10. Nitro given x1 with no relief. EMS reports 116/66  P70-90 irregular. R-18 98% RA. Patient was seen at ED in [place], [place] yesterday for chest pain. Per EMS Pt refused Asprin.

## 2014-04-21 ENCOUNTER — Encounter (HOSPITAL_COMMUNITY): Payer: Self-pay | Admitting: General Practice

## 2014-04-21 ENCOUNTER — Observation Stay (HOSPITAL_COMMUNITY): Payer: Medicare Other

## 2014-04-21 DIAGNOSIS — Z91148 Patient's other noncompliance with medication regimen for other reason: Secondary | ICD-10-CM

## 2014-04-21 DIAGNOSIS — Z95810 Presence of automatic (implantable) cardiac defibrillator: Secondary | ICD-10-CM | POA: Diagnosis present

## 2014-04-21 DIAGNOSIS — Z9114 Patient's other noncompliance with medication regimen: Secondary | ICD-10-CM

## 2014-04-21 DIAGNOSIS — I482 Chronic atrial fibrillation, unspecified: Secondary | ICD-10-CM | POA: Diagnosis present

## 2014-04-21 DIAGNOSIS — I4891 Unspecified atrial fibrillation: Secondary | ICD-10-CM

## 2014-04-21 DIAGNOSIS — Z9119 Patient's noncompliance with other medical treatment and regimen: Secondary | ICD-10-CM

## 2014-04-21 DIAGNOSIS — I209 Angina pectoris, unspecified: Secondary | ICD-10-CM

## 2014-04-21 DIAGNOSIS — I10 Essential (primary) hypertension: Secondary | ICD-10-CM | POA: Diagnosis present

## 2014-04-21 DIAGNOSIS — Z91199 Patient's noncompliance with other medical treatment and regimen due to unspecified reason: Secondary | ICD-10-CM

## 2014-04-21 DIAGNOSIS — R079 Chest pain, unspecified: Secondary | ICD-10-CM

## 2014-04-21 DIAGNOSIS — I509 Heart failure, unspecified: Secondary | ICD-10-CM

## 2014-04-21 DIAGNOSIS — R0789 Other chest pain: Secondary | ICD-10-CM | POA: Diagnosis not present

## 2014-04-21 DIAGNOSIS — I2510 Atherosclerotic heart disease of native coronary artery without angina pectoris: Secondary | ICD-10-CM | POA: Diagnosis present

## 2014-04-21 DIAGNOSIS — I5022 Chronic systolic (congestive) heart failure: Secondary | ICD-10-CM | POA: Diagnosis present

## 2014-04-21 DIAGNOSIS — K7460 Unspecified cirrhosis of liver: Secondary | ICD-10-CM | POA: Diagnosis present

## 2014-04-21 DIAGNOSIS — F17200 Nicotine dependence, unspecified, uncomplicated: Secondary | ICD-10-CM

## 2014-04-21 DIAGNOSIS — I369 Nonrheumatic tricuspid valve disorder, unspecified: Secondary | ICD-10-CM

## 2014-04-21 DIAGNOSIS — I420 Dilated cardiomyopathy: Secondary | ICD-10-CM | POA: Diagnosis present

## 2014-04-21 DIAGNOSIS — Z720 Tobacco use: Secondary | ICD-10-CM | POA: Diagnosis present

## 2014-04-21 LAB — TROPONIN I
Troponin I: <0.3 ng/mL (ref <0.30)
Troponin I: <0.3 ng/mL (ref <0.30)
Troponin I: <0.3 ng/mL (ref <0.30)

## 2014-04-21 MED ORDER — SODIUM CHLORIDE 0.9 % IJ SOLN: 10.0000 mL | INTRAMUSCULAR | Status: DC | PRN

## 2014-04-21 MED ORDER — MAGNESIUM OXIDE 400 (241.3 MG) MG PO TABS
400.0000 mg | ORAL_TABLET | Freq: Two times a day (BID) | ORAL | Status: DC
  Administered 2014-04-21: 400 mg via ORAL
  Filled 2014-04-21 (×2): qty 1

## 2014-04-21 MED ORDER — POTASSIUM CHLORIDE ER 10 MEQ PO TBCR
10.0000 meq | EXTENDED_RELEASE_TABLET | Freq: Two times a day (BID) | ORAL | Status: DC
  Administered 2014-04-21 (×2): 10 meq via ORAL
  Filled 2014-04-21 (×3): qty 1

## 2014-04-21 MED ORDER — FUROSEMIDE 40 MG PO TABS
40.0000 mg | ORAL_TABLET | Freq: Every day | ORAL | Status: DC
  Filled 2014-04-21: qty 1

## 2014-04-21 MED ORDER — ALBUTEROL SULFATE (2.5 MG/3ML) 0.083% IN NEBU: 2.5000 mg | INHALATION_SOLUTION | RESPIRATORY_TRACT | Status: DC | PRN

## 2014-04-21 MED ORDER — RIVAROXABAN 20 MG PO TABS
20.0000 mg | ORAL_TABLET | Freq: Every day | ORAL | Status: DC
  Administered 2014-04-21: 20 mg via ORAL
  Filled 2014-04-21: qty 1

## 2014-04-21 MED ORDER — ATORVASTATIN CALCIUM 40 MG PO TABS
40.0000 mg | ORAL_TABLET | Freq: Every day | ORAL | Status: DC
  Administered 2014-04-21: 40 mg via ORAL
  Filled 2014-04-21: qty 1

## 2014-04-21 MED ORDER — BENAZEPRIL HCL 40 MG PO TABS
40.0000 mg | ORAL_TABLET | Freq: Every day | ORAL | Status: DC
  Administered 2014-04-21: 40 mg via ORAL
  Filled 2014-04-21: qty 1

## 2014-04-21 MED ORDER — ISOSORBIDE MONONITRATE ER 30 MG PO TB24
30.0000 mg | ORAL_TABLET | Freq: Every day | ORAL | Status: DC
  Administered 2014-04-21: 30 mg via ORAL
  Filled 2014-04-21: qty 1

## 2014-04-21 MED ORDER — AMLODIPINE BESYLATE 10 MG PO TABS
10.0000 mg | ORAL_TABLET | Freq: Every day | ORAL | Status: DC
  Administered 2014-04-21: 10 mg via ORAL
  Filled 2014-04-21: qty 1

## 2014-04-21 MED ORDER — DIGOXIN 125 MCG PO TABS
0.1250 mg | ORAL_TABLET | Freq: Every day | ORAL | Status: DC
  Administered 2014-04-21: 0.125 mg via ORAL
  Filled 2014-04-21: qty 1

## 2014-04-21 MED ORDER — PANTOPRAZOLE SODIUM 40 MG PO TBEC
40.0000 mg | DELAYED_RELEASE_TABLET | Freq: Every day | ORAL | Status: DC
  Administered 2014-04-21: 40 mg via ORAL
  Filled 2014-04-21: qty 1

## 2014-04-21 MED ORDER — GI COCKTAIL ~~LOC~~: 30.0000 mL | Freq: Four times a day (QID) | ORAL | Status: DC | PRN

## 2014-04-21 MED ORDER — ACETAMINOPHEN 325 MG PO TABS: 650.0000 mg | ORAL_TABLET | ORAL | Status: DC | PRN

## 2014-04-21 MED ORDER — SODIUM CHLORIDE 0.9 % IJ SOLN
10.0000 mL | INTRAMUSCULAR | Status: DC | PRN
  Administered 2014-04-21 (×2): 10 mL

## 2014-04-21 MED ORDER — FUROSEMIDE 10 MG/ML IJ SOLN
40.0000 mg | Freq: Once | INTRAMUSCULAR | Status: AC
  Administered 2014-04-21: 40 mg via INTRAVENOUS
  Filled 2014-04-21: qty 4

## 2014-04-21 MED ORDER — SERTRALINE HCL 50 MG PO TABS
50.0000 mg | ORAL_TABLET | Freq: Every day | ORAL | Status: DC
  Administered 2014-04-21: 50 mg via ORAL
  Filled 2014-04-21: qty 1

## 2014-04-21 MED ORDER — FUROSEMIDE 40 MG PO TABS: 40.0000 mg | ORAL_TABLET | Freq: Every day | ORAL | Status: DC

## 2014-04-21 MED ORDER — ONDANSETRON HCL 4 MG/2ML IJ SOLN: 4.0000 mg | Freq: Four times a day (QID) | INTRAMUSCULAR | Status: DC | PRN

## 2014-04-21 MED ORDER — MORPHINE SULFATE 2 MG/ML IJ SOLN: 2.0000 mg | INTRAMUSCULAR | Status: DC | PRN

## 2014-04-21 MED ORDER — SODIUM CHLORIDE 0.9 % IJ SOLN: 10.0000 mL | Freq: Two times a day (BID) | INTRAMUSCULAR | Status: DC

## 2014-04-21 NOTE — Progress Notes (Signed)
Peripherally Inserted Central Catheter/Midline Placement  The IV Nurse has discussed with the patient and/or persons authorized to consent for the patient, the purpose of this procedure and the potential benefits and risks involved with this procedure.  The benefits include less needle sticks, lab draws from the catheter and patient may be discharged home with the catheter.  Risks include, but not limited to, infection, bleeding, blood clot (thrombus formation), and puncture of an artery; nerve damage and irregular heat beat.  Alternatives to this procedure were also discussed.  PICC/Midline Placement Documentation        Vevelyn Pat 04/21/2014, 3:34 PM

## 2014-04-21 NOTE — H&P (Signed)
PATIENT DETAILS Name: Tim Reyes Age: 48 y.o. Sex: male Date of Birth: 1966-02-24 Admit Date: 04/20/2014 PCP:No primary provider on file.   CHIEF COMPLAINT:  Chest pain  HPI: Tim Reyes is a 48 y.o. male with a Past Medical History of chronic systolic heart failure (last EF per care everywhere around 15% in June 2012),s/p ICD/pacemaker, HLD, ischemic cardiomyopathy, CAD s/p angioplasty x 1 vessel 07/2004, cardiac catheterization Feb 2012 20% LCx & RCA, chronic atrial fibrillation, alcohol and marijuana abuse, liver cirrhosis who presents today with the above noted complaint. Per patient, he is here visiting family in [place], he lives in [place], [place]. He claims that he had 15 minute duration of chest pain in bilateral chest area that he describes as pressure-like. There was no radiation of the pain. There was no associated nausea, vomiting, diaphoresis or palpitations. He claims that the pain was relieved after IV Dilaudid. He was subsequently referred to the hospitalist service for further evaluation and treatment.  Per patient, and also after reviewing care everywhere, patient was seen in a local hospital in [place], [facility] for similar complaints. Patient denies any other complaints, he has chronic exertional dyspnea and can only walk approximately one block. He has chronic two-pillow orthopnea, and chronic 2+ pedal edema at baseline. He claims that he still smokes and uses marijuana intermittently. Although he denied any ongoing alcohol abuse to me, he apparently has a history of alcohol abuse and liver cirrhosis per review of his outpatient medical records. He has a chronic PICC line in place, and claims that he goes to the infusion center at his hospital, 3 times a week for an infusion of a "heart" medication. Upon initial evaluation in the ED by this M.D., patient was very sleepy, and a very poor historian. He however was chest pain-free, and seemed  comfortable.  ALLERGIES:   Allergies  Allergen Reactions  . Vicodin [Hydrocodone-Acetaminophen]     sick    PAST MEDICAL HISTORY: Past Medical History  Diagnosis Date  . Hypertension   . MI (myocardial infarction) 2007  . Bronchitis   . Pacemaker     PAST SURGICAL HISTORY: Past Surgical History  Procedure Laterality Date  . Tonsillectomy      MEDICATIONS AT HOME: Prior to Admission medications   Medication Sig Start Date End Date Taking? Authorizing Provider  albuterol (PROVENTIL) (2.5 MG/3ML) 0.083% nebulizer solution Take 2.5 mg by nebulization every 6 (six) hours as needed for wheezing or shortness of breath.   Yes Historical Provider, MD  amLODipine (NORVASC) 10 MG tablet Take 10 mg by mouth daily.   Yes Historical Provider, MD  amoxicillin-clavulanate (AUGMENTIN) 500-125 MG per tablet Take 1 tablet by mouth every 8 (eight) hours. Take for 7 days only. Patient is unsure when he started course of medication. Patient has 15 pills in bottle as of 04-20-14   Yes Historical Provider, MD  atorvastatin (LIPITOR) 40 MG tablet Take 40 mg by mouth daily.   Yes Historical Provider, MD  benazepril (LOTENSIN) 40 MG tablet Take 40 mg by mouth daily.   Yes Historical Provider, MD  digoxin (LANOXIN) 0.125 MG tablet Take 0.125 mg by mouth daily.   Yes Historical Provider, MD  doxycycline (VIBRA-TABS) 100 MG tablet Take 100 mg by mouth every 12 (twelve) hours.   Yes Historical Provider, MD  furosemide (LASIX) 40 MG tablet Take 40 mg by mouth.   Yes Historical Provider, MD  isosorbide mononitrate (IMDUR) 30 MG 24  hr tablet Take 30 mg by mouth daily.   Yes Historical Provider, MD  magnesium oxide (MAG-OX) 400 MG tablet Take 400 mg by mouth 2 (two) times daily.   Yes Historical Provider, MD  omeprazole (PRILOSEC) 20 MG capsule Take 20 mg by mouth daily.   Yes Historical Provider, MD  potassium chloride (K-DUR) 10 MEQ tablet Take 10 mEq by mouth 2 (two) times daily.   Yes Historical Provider, MD    rivaroxaban (XARELTO) 20 MG TABS tablet Take 20 mg by mouth daily with supper.   Yes Historical Provider, MD  sertraline (ZOLOFT) 50 MG tablet Take 50 mg by mouth daily.   Yes Historical Provider, MD    FAMILY HISTORY: CAD-father and brother.  SOCIAL HISTORY:  reports that he has been smoking Cigarettes.  He has been smoking about 0.50 packs per day. He does not have any smokeless tobacco history on file. He reports that he drinks alcohol. He reports that he uses illicit drugs (Marijuana).  REVIEW OF SYSTEMS:  Constitutional:   No  weight loss, night sweats,  Fevers, chills, fatigue.  HEENT:    No headaches, Difficulty swallowing,Tooth/dental problems,Sore throat,  No sneezing, itching, ear ache, nasal congestion, post nasal drip,   Cardio-vascular: No  dizziness, palpitations  GI:  No heartburn, indigestion, abdominal pain, nausea, vomiting, diarrhea, change in bowel habits, loss of appetite  Resp: No shortness of breath  at rest.  No excess mucus, no productive cough, No non-productive cough,  No coughing up of blood.No change in color of mucus.No wheezing.No chest wall deformity  Skin:  no rash or lesions.  GU:  no dysuria, change in color of urine, no urgency or frequency.  No flank pain.  Musculoskeletal: No joint pain or swelling.  No decreased range of motion.  No back pain.  Psych: No change in mood or affect. No depression or anxiety.  No memory loss.   PHYSICAL EXAM: Blood pressure 98/68, pulse 75, temperature 98.7 F (37.1 C), temperature source Oral, resp. rate 20, height 5\' 11"  (1.803 m), weight 97.523 kg (215 lb), SpO2 99.00%.  General appearance :Awake, alert- but sleepy, not in any distress. Speech Clear. Not toxic Looking HEENT: Atraumatic and Normocephalic, pupils equally reactive to light and accomodation Neck: supple, no JVD. No cervical lymphadenopathy.  Chest:Good air entry bilaterally, no added sounds  CVS: S1 S2 irregular, no murmurs.   Abdomen: Bowel sounds present, Non tender and not distended with no gaurding, rigidity or rebound. Extremities: B/L Lower Ext shows 2+ edema, both legs are warm to touch Neurology: Awake alert, and oriented X 3, CN II-XII intact, Non focal Skin:No Rash Wounds:N/A  LABS ON ADMISSION:   Recent Labs  04/20/14 2159  NA 143  K 3.3*  CL 104  CO2 25  GLUCOSE 87  BUN 17  CREATININE 1.13  CALCIUM 8.9    Recent Labs  04/20/14 2159  AST 21  ALT 9  ALKPHOS 75  BILITOT 2.2*  PROT 6.9  ALBUMIN 3.4*   No results found for this basename: LIPASE, AMYLASE,  in the last 72 hours  Recent Labs  04/20/14 2159  WBC 4.7  HGB 8.9*  HCT 29.1*  MCV 73.9*  PLT 279   No results found for this basename: CKTOTAL, CKMB, CKMBINDEX, TROPONINI,  in the last 72 hours No results found for this basename: DDIMER,  in the last 72 hours No components found with this basename: POCBNP,    RADIOLOGIC STUDIES ON ADMISSION: Dg Chest Portable 1  View  04/20/2014   CLINICAL DATA:  CHEST PAIN picc line placement  EXAM: PORTABLE CHEST - 1 VIEW  COMPARISON:  None.  FINDINGS: Left subclavian AICD. Moderate cardiomegaly. Right arm PICC extends to the level of the right brachiocephalic vein. Lungs clear. No effusion. Regional bones unremarkable.  IMPRESSION: 1. Cardiomegaly. 2. Right arm PICC to right brachiocephalic vein.   Electronically Signed   By: Oley Balm M.D.   On: 04/20/2014 21:17     EKG: Independently reviewed. Atrial flutter  ASSESSMENT AND PLAN: Present on Admission:  . Chest pain - Patient with known coronary artery disease, chronic systolic heart failure with ischemic cardiomyopathy. However I feel that this patient's pain is mostly atypical in nature, but given his significant cardiac history he will be admitted to a telemetry unit, cardiac enzymes will be cycled. Given the fact that he is on Xarelto, and he has refused aspirin, will hold off on starting aspirin for now, if cardiac enzymes  were to be positive we can always reconsider. A 2-D echocardiogram will be obtained, but suspect if cardiac enzymes negative amount patient can be discharged tomorrow.   . Chronic systolic congestive heart failure, NYHA class 4 - Per patient, he has chronic bilateral pedal edema which is currently at baseline. He is otherwise compensated.he will be continued on Lasix and ACE inhibitor. I am not sure why he is not on a beta blocker at this time-suspect because of history of polysubstance abuse. Blood pressure is currently soft, would not add beta blockers at this time.   Marland Kitchen A-fib - Rate controlled, continue Coreg, continue Xarelto   . HTN (hypertension) - BP stable, in fact on the soft side, continue usual antihypertensive medications. Holding parameters placed for antihypertensive medications.   Marland Kitchen CAD (coronary artery disease) - As above-known history of coronary artery disease and ischemic cardiomyopathy. Cycle cardiac enzymes.   . Liver cirrhosis - Currently stable.   . Tobacco abuse - Counseled  Further plan will depend as patient's clinical course evolves and further radiologic and laboratory data become available. Patient will be monitored closely.  Above noted plan was discussed with patient, he was in agreement.   DVT Prophylaxis: Not needed as on Xarelto  Code Status: Full Code  Total time spent for admission equals 45 minutes.  [facility] Pager [phone]  If 7PM-7AM, please contact night-coverage [email or web address] Password [email or web address] 04/21/2014, 12:17 AM  **Disclaimer: This note may have been dictated with voice recognition software. Similar sounding words can inadvertently be transcribed and this note may contain transcription errors which may not have been corrected upon publication of note.**

## 2014-04-21 NOTE — Progress Notes (Signed)
UR completed 

## 2014-04-21 NOTE — Progress Notes (Signed)
*  PRELIMINARY RESULTS* Echocardiogram 2D Echocardiogram has been performed.  Tim Reyes 04/21/2014, 8:35 AM

## 2014-04-21 NOTE — Consult Note (Signed)
  CONSULTATION NOTE  Reason for Consult: Chest pain  Requesting Physician: Dr. Ghimire  Cardiologist: Dr. Lindsey Ahlin, [facility]  HPI: This is a 48 y.o. male with a past medical history significant for is a 48 y.o. male with a Past Medical History of chronic systolic heart failure (last EF per care everywhere around 15% in June 2012),s/p Biotronik ICD/pacemaker in 2011, HLD, ischemic cardiomyopathy, CAD (coronary artery disease) 08/14/04 Angioplasty x 1 vessel 11/05, Cath Feb2012 20% LCx & RCA , cardiac catheterization Feb 2012 20% LCx & RCA, chronic atrial fibrillation, alcohol and marijuana abuse, liver cirrhosis who presents today with the above noted complaint. Per patient, he is here visiting family in [place], he lives in [place], [place]. He claims that he had 15 minute duration of chest pain in bilateral chest area that he describes as pressure-like. There was no radiation of the pain. There was no associated nausea, vomiting, diaphoresis or palpitations. He claims that the pain was relieved after IV Dilaudid. He was subsequently referred to the hospitalist service for further evaluation and treatment. Per patient, and also after reviewing care everywhere, patient was seen in a local hospital in [place], [facility] for similar complaints just 2 days ago.   Diagnosis Date  . Ischemic cardiomyopathy 08/14/04  Ef=30%  . HLD (hyperlipidemia)  . Tobacco abuse  . PUD (peptic ulcer disease)  . Marijuana abuse  . Noncompliance with medication regimen  . Alcohol abuse  . CAD (coronary artery disease) 08/14/04  Angioplasty x 1 vessel 11/05, Cath Feb2012 20% LCx & RCA  . Hypertension  . Systolic and diastolic CHF, acute on chronic  June 2012 EF = 15% by echo  . Pacemaker 2011  . Congestive heart failure, unspecified  CHF book given 11/18/2012, pt has scales 05/25/2013 Digital scales provided and usage reviewed. Provided CHF zones and  weight logs  . Atrial fibrillation  . Atrial thrombus  . Cirrhosis of liver  . Cannabinosis   PMHx:  Past Medical History  Diagnosis Date  . Hypertension   . MI (myocardial infarction) 2007  . Bronchitis   . Pacemaker   . Dysrhythmia   . CHF (congestive heart failure)   . Sleep apnea    Past Surgical History  Procedure Laterality Date  . Tonsillectomy    . Insert / replace / remove pacemaker      FAMHx: Family History  Problem Relation Age of Onset  . Heart disease Father   . Heart disease Sister   . Heart disease Mother     SOCHx:  reports that he has been smoking Cigarettes.  He has a 7.5 pack-year smoking history. He does not have any smokeless tobacco history on file. He reports that he drinks alcohol. He reports that he uses illicit drugs (Marijuana).  ALLERGIES: Allergies  Allergen Reactions  . Vicodin [Hydrocodone-Acetaminophen]     sick    ROS: A comprehensive review of systems was negative except for: Respiratory: positive for dyspnea on exertion Cardiovascular: positive for chest pain  HOME MEDICATIONS: Prescriptions prior to admission  Medication Sig Dispense Refill  . albuterol (PROVENTIL) (2.5 MG/3ML) 0.083% nebulizer solution Take 2.5 mg by nebulization every 6 (six) hours as needed for wheezing or shortness of breath.      . amLODipine (NORVASC) 10 MG tablet Take 10 mg by mouth daily.      . amoxicillin-clavulanate (AUGMENTIN) 500-125 MG per tablet Take 1 tablet by mouth every 8 (eight) hours. Take for 7   days only. Patient is unsure when he started course of medication. Patient has 15 pills in bottle as of 04-20-14      . atorvastatin (LIPITOR) 40 MG tablet Take 40 mg by mouth daily.      . benazepril (LOTENSIN) 40 MG tablet Take 40 mg by mouth daily.      . digoxin (LANOXIN) 0.125 MG tablet Take 0.125 mg by mouth daily.      . doxycycline (VIBRA-TABS) 100 MG tablet Take 100 mg by mouth every 12 (twelve) hours.      . furosemide (LASIX) 40 MG tablet  Take 40 mg by mouth.      . isosorbide mononitrate (IMDUR) 30 MG 24 hr tablet Take 30 mg by mouth daily.      . magnesium oxide (MAG-OX) 400 MG tablet Take 400 mg by mouth 2 (two) times daily.      . omeprazole (PRILOSEC) 20 MG capsule Take 20 mg by mouth daily.      . potassium chloride (K-DUR) 10 MEQ tablet Take 10 mEq by mouth 2 (two) times daily.      . rivaroxaban (XARELTO) 20 MG TABS tablet Take 20 mg by mouth daily with supper.      . sertraline (ZOLOFT) 50 MG tablet Take 50 mg by mouth daily.        HOSPITAL MEDICATIONS: Scheduled: . amLODipine  10 mg Oral Daily  . atorvastatin  40 mg Oral Daily  . benazepril  40 mg Oral Daily  . digoxin  0.125 mg Oral Daily  . furosemide  40 mg Oral Daily  . isosorbide mononitrate  30 mg Oral Daily  . magnesium oxide  400 mg Oral BID  . pantoprazole  40 mg Oral Daily  . potassium chloride  10 mEq Oral BID  . rivaroxaban  20 mg Oral Q supper  . sertraline  50 mg Oral Daily    VITALS: Blood pressure 116/97, pulse 77, temperature 97.8 F (36.6 C), temperature source Oral, resp. rate 18, height 5' 11" (1.803 m), weight 194 lb 6.3 oz (88.177 kg), SpO2 100.00%.  PHYSICAL EXAM: General appearance: alert and no distress Neck: JVD - 4 cm above sternal notch and no carotid bruit Lungs: clear to auscultation bilaterally Heart: regular rate and rhythm, S1, S2 normal, S3 present and systolic murmur: early systolic 3/6, blowing at apex Abdomen: soft, non-tender; bowel sounds normal; no masses,  no organomegaly and scaphoid Extremities: edema trace bilateral pedal Pulses: 1+ symmetric Skin: Skin color, texture, turgor normal. No rashes or lesions Neurologic: Mental status: Alert, oriented, thought content appropriate Psych: Normal mood, affect  LABS: Results for orders placed during the hospital encounter of 04/20/14 (from the past 48 hour(s))  COMPREHENSIVE METABOLIC PANEL     Status: Abnormal   Collection Time    04/20/14  9:59 PM      Result  Value Ref Range   Sodium 143  137 - 147 mEq/L   Potassium 3.3 (*) 3.7 - 5.3 mEq/L   Chloride 104  96 - 112 mEq/L   CO2 25  19 - 32 mEq/L   Glucose, Bld 87  70 - 99 mg/dL   BUN 17  6 - 23 mg/dL   Creatinine, Ser 1.13  0.50 - 1.35 mg/dL   Calcium 8.9  8.4 - 10.5 mg/dL   Total Protein 6.9  6.0 - 8.3 g/dL   Albumin 3.4 (*) 3.5 - 5.2 g/dL   AST 21  0 - 37 U/L   ALT   9  0 - 53 U/L   Alkaline Phosphatase 75  39 - 117 U/L   Total Bilirubin 2.2 (*) 0.3 - 1.2 mg/dL   GFR calc non Af Amer 75 (*) >90 mL/min   GFR calc Af Amer 87 (*) >90 mL/min   Comment: (NOTE)     The eGFR has been calculated using the CKD EPI equation.     This calculation has not been validated in all clinical situations.     eGFR's persistently <90 mL/min signify possible Chronic Kidney     Disease.   Anion gap 14  5 - 15  PRO B NATRIURETIC PEPTIDE     Status: Abnormal   Collection Time    04/20/14  9:59 PM      Result Value Ref Range   Pro B Natriuretic peptide (BNP) 2601.0 (*) 0 - 125 pg/mL  PROTIME-INR     Status: Abnormal   Collection Time    04/20/14  9:59 PM      Result Value Ref Range   Prothrombin Time 19.9 (*) 11.6 - 15.2 seconds   INR 1.69 (*) 0.00 - 1.49  CBC     Status: Abnormal   Collection Time    04/20/14  9:59 PM      Result Value Ref Range   WBC 4.7  4.0 - 10.5 K/uL   RBC 3.94 (*) 4.22 - 5.81 MIL/uL   Hemoglobin 8.9 (*) 13.0 - 17.0 g/dL   HCT 29.1 (*) 39.0 - 52.0 %   MCV 73.9 (*) 78.0 - 100.0 fL   MCH 22.6 (*) 26.0 - 34.0 pg   MCHC 30.6  30.0 - 36.0 g/dL   RDW 19.4 (*) 11.5 - 15.5 %   Platelets 279  150 - 400 K/uL  I-STAT TROPOININ, ED     Status: None   Collection Time    04/20/14 10:07 PM      Result Value Ref Range   Troponin i, poc 0.02  0.00 - 0.08 ng/mL   Comment 3            Comment: Due to the release kinetics of cTnI,     a negative result within the first hours     of the onset of symptoms does not rule out     myocardial infarction with certainty.     If myocardial  infarction is still suspected,     repeat the test at appropriate intervals.  TROPONIN I     Status: None   Collection Time    04/21/14  2:47 AM      Result Value Ref Range   Troponin I <0.30  <0.30 ng/mL   Comment:            Due to the release kinetics of cTnI,     a negative result within the first hours     of the onset of symptoms does not rule out     myocardial infarction with certainty.     If myocardial infarction is still suspected,     repeat the test at appropriate intervals.  TROPONIN I     Status: None   Collection Time    04/21/14  4:39 AM      Result Value Ref Range   Troponin I <0.30  <0.30 ng/mL   Comment:            Due to the release kinetics of cTnI,     a negative result within the first hours       of the onset of symptoms does not rule out     myocardial infarction with certainty.     If myocardial infarction is still suspected,     repeat the test at appropriate intervals.    IMAGING: Dg Chest Portable 1 View  04/20/2014   CLINICAL DATA:  CHEST PAIN picc line placement  EXAM: PORTABLE CHEST - 1 VIEW  COMPARISON:  None.  FINDINGS: Left subclavian AICD. Moderate cardiomegaly. Right arm PICC extends to the level of the right brachiocephalic vein. Lungs clear. No effusion. Regional bones unremarkable.  IMPRESSION: 1. Cardiomegaly. 2. Right arm PICC to right brachiocephalic vein.   Electronically Signed   By: Daniel  Hassell M.D.   On: 04/20/2014 21:17   ECHO: Brief review at bedside, LVEF ~15-20%, dilated, global hypokinesis, mild to moderate MR, moderate TR, ICD leads in place, no pericardial effusion  HOSPITAL DIAGNOSES: Principal Problem:   Chest pain Active Problems:   Chronic systolic congestive heart failure, NYHA class 4   A-fib   HTN (hypertension)   CAD (coronary artery disease)   Non compliance w medication regimen   Liver cirrhosis   Tobacco abuse   IMPRESSION: 1. Non-cardiac chest pain - r/o for ACS 2. Chronic systolic congestive heart  failure, NYHA Class 4 on intermittent dobutamine infusions through PICC line 3. History of polysubstance abuse 4. Prior single vessel CAD, out of proportion to cardiomyopathy (predominantly non-ischemic)  RECOMMENDATION: 1. Mr. Toppins had more "severe" chest pain which was relieved with opiates. This is similar to the pain he had in the ER at [facility] 2 days ago. He was released then. He has ruled-out for MI here. Exam demonstrates mildly decompensated CHF. BNP is probably not unusually high given the severity of his cardiomyopathy. He denies pain today or worsening shortness of breath. He says he gets inotrope infusions 3x weekly, but came to visit in [place] and was not going back for a while.  I'm not sure his cardiologist is aware of this. I would not recommend further ischemia work-up at this time. Would be reasonable to give him an extra dose of lasix this morning. Probably can be discharged to follow-up with his cardiologist at [facility].  Time Spent Directly with Patient: 45 minutes  Kenneth C. Hilty, MD, FACC Attending Cardiologist [facility]  HILTY,Kenneth C 04/21/2014, 8:02 AM       

## 2014-04-21 NOTE — Discharge Summary (Signed)
Discharge Summary  Tim Reyes [name] DOB: 07-04-1966  PCP: No primary provider on file.  Admit date: 04/20/2014 Discharge date: 04/21/2014  Time spent: 25 minutes  Recommendations for Outpatient Follow-up:  1. Patient is advised to followup with his cardiologist as soon as possible as he has missed many of his milrinone sessions   Discharge Diagnoses:  Active Hospital Problems   Diagnosis Date Noted  . Chest pain 04/20/2014  . Chronic systolic congestive heart failure, NYHA class 4 04/21/2014  . A-fib 04/21/2014  . HTN (hypertension) 04/21/2014  . CAD (coronary artery disease) 04/21/2014  . Non compliance w medication regimen 04/21/2014  . Liver cirrhosis 04/21/2014  . Tobacco abuse 04/21/2014  . AICD (automatic cardioverter/defibrillator) present 04/21/2014  . Congestive dilated cardiomyopathy 04/21/2014  . Noncompliance 04/21/2014    Resolved Hospital Problems   Diagnosis Date Noted Date Resolved  No resolved problems to display.    Discharge Condition: Improved, being discharged home  Diet recommendation: Heart healthy  Filed Weights   04/20/14 2035 04/21/14 0100 04/21/14 0500  Weight: 97.523 kg (215 lb) 88.179 kg (194 lb 6.4 oz) 88.177 kg (194 lb 6.3 oz)    History of present illness:  48 year old African American male with past medical history of advanced chronic systolic heart failure from ischemic cardiomyopathy who is on chronic milrinone who normally is from [place] that came to visit his family in [place]. Patient states that his family stressing out because they were fighting and sister having chest pressure and came into the emergency room for further evaluation. Initial enzymes and EKG were unremarkable. He was admitted to the hospitalist service.  Hospital Course:  Principal Problem:   Chest pain: Despite his advanced heart issues, pain is atypical. Active Problems:   Chronic systolic congestive heart failure, NYHA class 4: Continue home dose of  Lasix and given an additional dose of IV Lasix given that he missed his milrinone. Enzymes x3 negative. See my cardiology and with echocardiogram noting nothing acutely abnormal than his baseline, his was ruled out for acute ACS and felt to be his baseline. Plan is that he needs to followup with his cardiologist and resume his milrinone treatments    A-fib: On Xarelto, rate controlled   HTN (hypertension)   CAD (coronary artery disease)    Liver cirrhosis   Tobacco abuse: Counseled. Patient declined nicotine patch.    AICD (automatic cardioverter/defibrillator) present   Congestive dilated cardiomyopathy   Noncompliance: Patient missed his milrinone infusions this week.  He normally supposed to be on them 3 times a week. When asked him if this was too much and he wanted to stop all of his treatments, he said no. He seems to be noncommittal and initially was thinking about going to [place] to move and change his treatments here. He seems edematous without planning while. He says that since his family's fighting, he likely will just go back to [place]   Procedures:  Echocardiogram done 8/6: Advanced systolic heart failure with ejection fraction of 15%  Consultations:  Cardiology  Discharge Exam: BP 117/69  Pulse 75  Temp(Src) 97.7 F (36.5 C) (Oral)  Resp 18  Ht 5\' 11"  (1.803 m)  Wt 88.177 kg (194 lb 6.3 oz)  BMI 27.12 kg/m2  SpO2 100%  General: Alert and oriented x3, no acute distress Cardiovascular: Irregular rhythm, rate controlled Respiratory: Clear to auscultation bilaterally  Discharge Instructions You were cared for by a hospitalist during your hospital stay. If you have any questions about your discharge medications  or the care you received while you were in the hospital after you are discharged, you can call the unit and asked to speak with the hospitalist on call if the hospitalist that took care of you is not available. Once you are discharged, your primary care  physician will handle any further medical issues. Please note that NO REFILLS for any discharge medications will be authorized once you are discharged, as it is imperative that you return to your primary care physician (or establish a relationship with a primary care physician if you do not have one) for your aftercare needs so that they can reassess your need for medications and monitor your lab values.  Discharge Instructions   Diet - low sodium heart healthy    Complete by:  As directed      Increase activity slowly    Complete by:  As directed             Medication List         albuterol (2.5 MG/3ML) 0.083% nebulizer solution  Commonly known as:  PROVENTIL  Take 2.5 mg by nebulization every 6 (six) hours as needed for wheezing or shortness of breath.     amLODipine 10 MG tablet  Commonly known as:  NORVASC  Take 10 mg by mouth daily.     amoxicillin-clavulanate 500-125 MG per tablet  Commonly known as:  AUGMENTIN  Take 1 tablet by mouth every 8 (eight) hours. Take for 7 days only. Patient is unsure when he started course of medication. Patient has 15 pills in bottle as of 04-20-14     atorvastatin 40 MG tablet  Commonly known as:  LIPITOR  Take 40 mg by mouth daily.     benazepril 40 MG tablet  Commonly known as:  LOTENSIN  Take 40 mg by mouth daily.     digoxin 0.125 MG tablet  Commonly known as:  LANOXIN  Take 0.125 mg by mouth daily.     doxycycline 100 MG tablet  Commonly known as:  VIBRA-TABS  Take 100 mg by mouth every 12 (twelve) hours.     furosemide 40 MG tablet  Commonly known as:  LASIX  Take 40 mg by mouth.     isosorbide mononitrate 30 MG 24 hr tablet  Commonly known as:  IMDUR  Take 30 mg by mouth daily.     magnesium oxide 400 MG tablet  Commonly known as:  MAG-OX  Take 400 mg by mouth 2 (two) times daily.     omeprazole 20 MG capsule  Commonly known as:  PRILOSEC  Take 20 mg by mouth daily.     potassium chloride 10 MEQ tablet  Commonly  known as:  K-DUR  Take 10 mEq by mouth 2 (two) times daily.     rivaroxaban 20 MG Tabs tablet  Commonly known as:  XARELTO  Take 20 mg by mouth daily with supper.     sertraline 50 MG tablet  Commonly known as:  ZOLOFT  Take 50 mg by mouth daily.       Allergies  Allergen Reactions  . Vicodin [Hydrocodone-Acetaminophen]     sick      The results of significant diagnostics from this hospitalization (including imaging, microbiology, ancillary and laboratory) are listed below for reference.    Significant Diagnostic Studies: Dg Chest Port 1 View  04/21/2014   CLINICAL DATA:  PICC line insertion.  EXAM: PORTABLE CHEST - 1 VIEW  COMPARISON:  04/20/2014.  FINDINGS: Cardiomegaly. Cardiac pacer  noted with lead tips in the right atrium and right ventricle. Pulmonary vascularity normal. PICC line has been advanced. Its tip is at the cavoatrial junction. Lungs are clear. No pleural effusion or pneumothorax.  IMPRESSION: 1. Interim advancement of PICC line. PICC line tip is at the cavoatrial junction. 2. Severe cardiomegaly.  No CHF.  Cardiac pacer in stable position.   Electronically Signed   By: Maisie Fushomas  Register   On: 04/21/2014 16:18   Dg Chest Portable 1 View  04/20/2014   CLINICAL DATA:  CHEST PAIN picc line placement  EXAM: PORTABLE CHEST - 1 VIEW  COMPARISON:  None.  FINDINGS: Left subclavian AICD. Moderate cardiomegaly. Right arm PICC extends to the level of the right brachiocephalic vein. Lungs clear. No effusion. Regional bones unremarkable.  IMPRESSION: 1. Cardiomegaly. 2. Right arm PICC to right brachiocephalic vein.   Electronically Signed   By: Oley Balmaniel  Hassell M.D.   On: 04/20/2014 21:17    Microbiology: No results found for this or any previous visit (from the past 240 hour(s)).   Labs: Basic Metabolic Panel:  Recent Labs Lab 04/20/14 2159  NA 143  K 3.3*  CL 104  CO2 25  GLUCOSE 87  BUN 17  CREATININE 1.13  CALCIUM 8.9   Liver Function Tests:  Recent Labs Lab  04/20/14 2159  AST 21  ALT 9  ALKPHOS 75  BILITOT 2.2*  PROT 6.9  ALBUMIN 3.4*   No results found for this basename: LIPASE, AMYLASE,  in the last 168 hours No results found for this basename: AMMONIA,  in the last 168 hours CBC:  Recent Labs Lab 04/20/14 2159  WBC 4.7  HGB 8.9*  HCT 29.1*  MCV 73.9*  PLT 279   Cardiac Enzymes:  Recent Labs Lab 04/21/14 0247 04/21/14 0439 04/21/14 0657  TROPONINI <0.30 <0.30 <0.30   BNP: BNP (last 3 results)  Recent Labs  04/20/14 2159  PROBNP 2601.0*   CBG: No results found for this basename: GLUCAP,  in the last 168 hours     Signed:  Hollice EspyKRISHNAN,Darcee Dekker K  [facility] 04/21/2014, 5:05 PM

## 2014-04-28 ENCOUNTER — Inpatient Hospital Stay (HOSPITAL_COMMUNITY)
Admission: EM | Admit: 2014-04-28 | Discharge: 2014-04-30 | DRG: 292 | Disposition: A | Discharge status: Home Health | Payer: Medicare Other | Attending: Internal Medicine | Admitting: Internal Medicine

## 2014-04-28 ENCOUNTER — Emergency Department (HOSPITAL_COMMUNITY): Payer: Medicare Other

## 2014-04-28 ENCOUNTER — Encounter (HOSPITAL_COMMUNITY): Payer: Self-pay | Admitting: Emergency Medicine

## 2014-04-28 DIAGNOSIS — I509 Heart failure, unspecified: Secondary | ICD-10-CM | POA: Diagnosis present

## 2014-04-28 DIAGNOSIS — I4891 Unspecified atrial fibrillation: Secondary | ICD-10-CM | POA: Diagnosis not present

## 2014-04-28 DIAGNOSIS — J209 Acute bronchitis, unspecified: Secondary | ICD-10-CM | POA: Diagnosis present

## 2014-04-28 DIAGNOSIS — Z9119 Patient's noncompliance with other medical treatment and regimen: Secondary | ICD-10-CM

## 2014-04-28 DIAGNOSIS — Z95810 Presence of automatic (implantable) cardiac defibrillator: Secondary | ICD-10-CM

## 2014-04-28 DIAGNOSIS — I2510 Atherosclerotic heart disease of native coronary artery without angina pectoris: Secondary | ICD-10-CM | POA: Diagnosis present

## 2014-04-28 DIAGNOSIS — F1210 Cannabis abuse, uncomplicated: Secondary | ICD-10-CM | POA: Diagnosis present

## 2014-04-28 DIAGNOSIS — Z9114 Patient's other noncompliance with medication regimen: Secondary | ICD-10-CM

## 2014-04-28 DIAGNOSIS — I420 Dilated cardiomyopathy: Secondary | ICD-10-CM

## 2014-04-28 DIAGNOSIS — I5033 Acute on chronic diastolic (congestive) heart failure: Secondary | ICD-10-CM | POA: Insufficient documentation

## 2014-04-28 DIAGNOSIS — R079 Chest pain, unspecified: Secondary | ICD-10-CM | POA: Diagnosis present

## 2014-04-28 DIAGNOSIS — I5043 Acute on chronic combined systolic (congestive) and diastolic (congestive) heart failure: Principal | ICD-10-CM | POA: Diagnosis present

## 2014-04-28 DIAGNOSIS — J219 Acute bronchiolitis, unspecified: Secondary | ICD-10-CM | POA: Diagnosis present

## 2014-04-28 DIAGNOSIS — J218 Acute bronchiolitis due to other specified organisms: Secondary | ICD-10-CM | POA: Diagnosis not present

## 2014-04-28 DIAGNOSIS — K761 Chronic passive congestion of liver: Secondary | ICD-10-CM | POA: Diagnosis present

## 2014-04-28 DIAGNOSIS — I482 Chronic atrial fibrillation, unspecified: Secondary | ICD-10-CM | POA: Diagnosis present

## 2014-04-28 DIAGNOSIS — Z598 Other problems related to housing and economic circumstances: Secondary | ICD-10-CM

## 2014-04-28 DIAGNOSIS — Z91199 Patient's noncompliance with other medical treatment and regimen due to unspecified reason: Secondary | ICD-10-CM

## 2014-04-28 DIAGNOSIS — I25118 Atherosclerotic heart disease of native coronary artery with other forms of angina pectoris: Secondary | ICD-10-CM

## 2014-04-28 DIAGNOSIS — I252 Old myocardial infarction: Secondary | ICD-10-CM

## 2014-04-28 DIAGNOSIS — Z91148 Patient's other noncompliance with medication regimen for other reason: Secondary | ICD-10-CM

## 2014-04-28 DIAGNOSIS — I2589 Other forms of chronic ischemic heart disease: Secondary | ICD-10-CM | POA: Diagnosis present

## 2014-04-28 DIAGNOSIS — I10 Essential (primary) hypertension: Secondary | ICD-10-CM | POA: Diagnosis present

## 2014-04-28 DIAGNOSIS — Z720 Tobacco use: Secondary | ICD-10-CM | POA: Diagnosis present

## 2014-04-28 DIAGNOSIS — K7030 Alcoholic cirrhosis of liver without ascites: Secondary | ICD-10-CM

## 2014-04-28 DIAGNOSIS — I5023 Acute on chronic systolic (congestive) heart failure: Secondary | ICD-10-CM

## 2014-04-28 DIAGNOSIS — F1020 Alcohol dependence, uncomplicated: Secondary | ICD-10-CM | POA: Diagnosis present

## 2014-04-28 DIAGNOSIS — E785 Hyperlipidemia, unspecified: Secondary | ICD-10-CM | POA: Diagnosis present

## 2014-04-28 DIAGNOSIS — J440 Chronic obstructive pulmonary disease with acute lower respiratory infection: Secondary | ICD-10-CM | POA: Diagnosis present

## 2014-04-28 DIAGNOSIS — I5022 Chronic systolic (congestive) heart failure: Secondary | ICD-10-CM | POA: Diagnosis present

## 2014-04-28 DIAGNOSIS — I209 Angina pectoris, unspecified: Secondary | ICD-10-CM

## 2014-04-28 DIAGNOSIS — Z5987 Material hardship due to limited financial resources, not elsewhere classified: Secondary | ICD-10-CM

## 2014-04-28 LAB — TROPONIN I: Troponin I: <0.3 ng/mL (ref <0.30)

## 2014-04-28 LAB — BASIC METABOLIC PANEL
Anion gap: 13 (ref 5–15)
Anion gap: 16 — ABNORMAL HIGH (ref 5–15)
BUN: 16 mg/dL (ref 6–23)
BUN: 17 mg/dL (ref 6–23)
CHLORIDE: 107 meq/L (ref 96–112)
CO2: 26 mEq/L (ref 19–32)
CO2: 23 meq/L (ref 19–32)
CREATININE: 0.95 mg/dL (ref 0.50–1.35)
Calcium: 9.1 mg/dL (ref 8.4–10.5)
Calcium: 9.1 mg/dL (ref 8.4–10.5)
Chloride: 101 mEq/L (ref 96–112)
Creatinine, Ser: 0.92 mg/dL (ref 0.50–1.35)
GFR calc Af Amer: >90 mL/min (ref >90)
GFR calc Af Amer: >90 mL/min (ref >90)
GFR calc non Af Amer: >90 mL/min (ref >90)
GFR calc non Af Amer: >90 mL/min (ref >90)
GLUCOSE: 166 mg/dL — AB (ref 70–99)
Glucose, Bld: 91 mg/dL (ref 70–99)
POTASSIUM: 3.3 meq/L — AB (ref 3.7–5.3)
POTASSIUM: 3.6 meq/L — AB (ref 3.7–5.3)
Sodium: 146 mEq/L (ref 137–147)
Sodium: 140 mEq/L (ref 137–147)

## 2014-04-28 LAB — RAPID URINE DRUG SCREEN, HOSP PERFORMED
Amphetamines: NOT DETECTED
BENZODIAZEPINES: NOT DETECTED
Barbiturates: NOT DETECTED
Cocaine: NOT DETECTED
Opiates: NOT DETECTED
TETRAHYDROCANNABINOL: POSITIVE — AB

## 2014-04-28 LAB — PRO B NATRIURETIC PEPTIDE: Pro B Natriuretic peptide (BNP): 4523 pg/mL — ABNORMAL HIGH (ref 0–125)

## 2014-04-28 LAB — CBC
HEMATOCRIT: 30.4 % — AB (ref 39.0–52.0)
Hemoglobin: 9.3 g/dL — ABNORMAL LOW (ref 13.0–17.0)
MCH: 22.4 pg — ABNORMAL LOW (ref 26.0–34.0)
MCHC: 30.6 g/dL (ref 30.0–36.0)
MCV: 73.1 fL — AB (ref 78.0–100.0)
PLATELETS: 240 10*3/uL (ref 150–400)
RBC: 4.16 MIL/uL — AB (ref 4.22–5.81)
RDW: 19.4 % — ABNORMAL HIGH (ref 11.5–15.5)
WBC: 5.5 10*3/uL (ref 4.0–10.5)

## 2014-04-28 LAB — DIGOXIN LEVEL: Digoxin Level: 0.4 ng/mL — ABNORMAL LOW (ref 0.8–2.0)

## 2014-04-28 LAB — I-STAT TROPONIN, ED: Troponin i, poc: 0.02 ng/mL (ref 0.00–0.08)

## 2014-04-28 MED ORDER — ONDANSETRON HCL 4 MG/2ML IJ SOLN
4.0000 mg | Freq: Four times a day (QID) | INTRAMUSCULAR | Status: DC | PRN
  Administered 2014-04-29: 4 mg via INTRAVENOUS
  Filled 2014-04-28: qty 2

## 2014-04-28 MED ORDER — SERTRALINE HCL 50 MG PO TABS
50.0000 mg | ORAL_TABLET | Freq: Every day | ORAL | Status: DC
  Administered 2014-04-28 – 2014-04-30 (×3): 50 mg via ORAL
  Filled 2014-04-28 (×3): qty 1

## 2014-04-28 MED ORDER — RIVAROXABAN 20 MG PO TABS
20.0000 mg | ORAL_TABLET | Freq: Every day | ORAL | Status: DC
  Administered 2014-04-28 – 2014-04-29 (×2): 20 mg via ORAL
  Filled 2014-04-28 (×3): qty 1

## 2014-04-28 MED ORDER — NICOTINE 21 MG/24HR TD PT24
21.0000 mg | MEDICATED_PATCH | Freq: Every day | TRANSDERMAL | Status: DC
  Administered 2014-04-28 – 2014-04-30 (×3): 21 mg via TRANSDERMAL
  Filled 2014-04-28 (×3): qty 1

## 2014-04-28 MED ORDER — LEVOFLOXACIN 750 MG PO TABS
750.0000 mg | ORAL_TABLET | Freq: Every day | ORAL | Status: DC
  Administered 2014-04-28 – 2014-04-30 (×3): 750 mg via ORAL
  Filled 2014-04-28 (×3): qty 1

## 2014-04-28 MED ORDER — POTASSIUM CHLORIDE ER 10 MEQ PO TBCR
10.0000 meq | EXTENDED_RELEASE_TABLET | Freq: Two times a day (BID) | ORAL | Status: DC
  Administered 2014-04-28 (×2): 10 meq via ORAL
  Filled 2014-04-28 (×4): qty 1

## 2014-04-28 MED ORDER — ASPIRIN 325 MG PO TABS: 325.0000 mg | ORAL_TABLET | ORAL | Status: AC

## 2014-04-28 MED ORDER — MORPHINE SULFATE 4 MG/ML IJ SOLN
4.0000 mg | Freq: Once | INTRAMUSCULAR | Status: AC
  Administered 2014-04-28: 4 mg via INTRAVENOUS
  Filled 2014-04-28: qty 1

## 2014-04-28 MED ORDER — LEVALBUTEROL HCL 0.63 MG/3ML IN NEBU
0.6300 mg | INHALATION_SOLUTION | Freq: Once | RESPIRATORY_TRACT | Status: DC
  Filled 2014-04-28: qty 3

## 2014-04-28 MED ORDER — SODIUM CHLORIDE 0.9 % IJ SOLN
3.0000 mL | Freq: Two times a day (BID) | INTRAMUSCULAR | Status: DC
  Administered 2014-04-29: 3 mL via INTRAVENOUS

## 2014-04-28 MED ORDER — IPRATROPIUM BROMIDE 0.02 % IN SOLN
0.5000 mg | Freq: Four times a day (QID) | RESPIRATORY_TRACT | Status: DC
  Administered 2014-04-28 (×2): 0.5 mg via RESPIRATORY_TRACT
  Filled 2014-04-28 (×2): qty 2.5

## 2014-04-28 MED ORDER — ACETAMINOPHEN 325 MG PO TABS
650.0000 mg | ORAL_TABLET | ORAL | Status: DC | PRN
  Administered 2014-04-29: 650 mg via ORAL
  Filled 2014-04-28: qty 2

## 2014-04-28 MED ORDER — ATORVASTATIN CALCIUM 40 MG PO TABS
40.0000 mg | ORAL_TABLET | Freq: Every day | ORAL | Status: DC
  Administered 2014-04-28 – 2014-04-30 (×3): 40 mg via ORAL
  Filled 2014-04-28 (×3): qty 1

## 2014-04-28 MED ORDER — ASPIRIN 81 MG PO CHEW
324.0000 mg | CHEWABLE_TABLET | Freq: Once | ORAL | Status: AC
  Administered 2014-04-28: 324 mg via ORAL
  Filled 2014-04-28: qty 4

## 2014-04-28 MED ORDER — FUROSEMIDE 10 MG/ML IJ SOLN
80.0000 mg | Freq: Once | INTRAMUSCULAR | Status: AC
  Administered 2014-04-28: 80 mg via INTRAVENOUS
  Filled 2014-04-28: qty 8

## 2014-04-28 MED ORDER — NITROGLYCERIN 0.4 MG SL SUBL
0.4000 mg | SUBLINGUAL_TABLET | SUBLINGUAL | Status: DC | PRN
  Administered 2014-04-29: 0.4 mg via SUBLINGUAL
  Filled 2014-04-28: qty 1

## 2014-04-28 MED ORDER — BENAZEPRIL HCL 40 MG PO TABS
40.0000 mg | ORAL_TABLET | Freq: Every day | ORAL | Status: DC
  Administered 2014-04-28 – 2014-04-30 (×3): 40 mg via ORAL
  Filled 2014-04-28 (×3): qty 1

## 2014-04-28 MED ORDER — LEVALBUTEROL HCL 0.63 MG/3ML IN NEBU
0.6300 mg | INHALATION_SOLUTION | Freq: Four times a day (QID) | RESPIRATORY_TRACT | Status: DC
  Administered 2014-04-28 (×2): 0.63 mg via RESPIRATORY_TRACT
  Filled 2014-04-28 (×2): qty 3

## 2014-04-28 MED ORDER — PANTOPRAZOLE SODIUM 40 MG PO TBEC
40.0000 mg | DELAYED_RELEASE_TABLET | Freq: Every day | ORAL | Status: DC
  Administered 2014-04-28 – 2014-04-29 (×2): 40 mg via ORAL
  Filled 2014-04-28 (×2): qty 1

## 2014-04-28 MED ORDER — NITROGLYCERIN 0.4 MG SL SUBL: 0.4000 mg | SUBLINGUAL_TABLET | SUBLINGUAL | Status: DC | PRN

## 2014-04-28 MED ORDER — FUROSEMIDE 10 MG/ML IJ SOLN
60.0000 mg | Freq: Two times a day (BID) | INTRAMUSCULAR | Status: DC
  Administered 2014-04-29: 60 mg via INTRAVENOUS
  Filled 2014-04-28 (×3): qty 6

## 2014-04-28 MED ORDER — DIGOXIN 125 MCG PO TABS
0.1250 mg | ORAL_TABLET | Freq: Every day | ORAL | Status: DC
  Administered 2014-04-28 – 2014-04-30 (×3): 0.125 mg via ORAL
  Filled 2014-04-28 (×3): qty 1

## 2014-04-28 MED ORDER — MAGNESIUM OXIDE 400 MG PO TABS
400.0000 mg | ORAL_TABLET | Freq: Two times a day (BID) | ORAL | Status: DC
  Administered 2014-04-28 – 2014-04-30 (×5): 400 mg via ORAL
  Filled 2014-04-28 (×7): qty 1

## 2014-04-28 MED ORDER — FUROSEMIDE 10 MG/ML IJ SOLN: 40.0000 mg | Freq: Once | INTRAMUSCULAR | Status: DC

## 2014-04-28 MED ORDER — METHYLPREDNISOLONE SODIUM SUCC 125 MG IJ SOLR
125.0000 mg | Freq: Once | INTRAMUSCULAR | Status: AC
  Administered 2014-04-28: 125 mg via INTRAVENOUS
  Filled 2014-04-28: qty 2

## 2014-04-28 MED ORDER — METHYLPREDNISOLONE SODIUM SUCC 125 MG IJ SOLR
60.0000 mg | Freq: Two times a day (BID) | INTRAMUSCULAR | Status: DC
  Administered 2014-04-29: 60 mg via INTRAVENOUS
  Filled 2014-04-28 (×4): qty 0.96

## 2014-04-28 MED ORDER — SODIUM CHLORIDE 0.9 % IJ SOLN: 3.0000 mL | INTRAMUSCULAR | Status: DC | PRN

## 2014-04-28 MED ORDER — ISOSORBIDE MONONITRATE ER 30 MG PO TB24
30.0000 mg | ORAL_TABLET | Freq: Every day | ORAL | Status: DC
  Administered 2014-04-28 – 2014-04-30 (×3): 30 mg via ORAL
  Filled 2014-04-28 (×3): qty 1

## 2014-04-28 MED ORDER — SODIUM CHLORIDE 0.9 % IJ SOLN
10.0000 mL | Freq: Two times a day (BID) | INTRAMUSCULAR | Status: DC
  Administered 2014-04-29: 10 mL

## 2014-04-28 MED ORDER — SODIUM CHLORIDE 0.9 % IV SOLN
250.0000 mL | INTRAVENOUS | Status: DC | PRN
  Administered 2014-04-30: 250 mL via INTRAVENOUS

## 2014-04-28 MED ORDER — SODIUM CHLORIDE 0.9 % IJ SOLN
10.0000 mL | INTRAMUSCULAR | Status: DC | PRN
  Administered 2014-04-28: 20 mL
  Administered 2014-04-28 – 2014-04-29 (×3): 10 mL
  Administered 2014-04-30: 20 mL

## 2014-04-28 NOTE — ED Notes (Signed)
Pt here with a c/o generalized cp that started yesterday evening around 5pm. Pt reports sob but no n/v or diaphoresis. Pt alert and oriented x 4. Rates pain 10/10. Pt had 1 nitro with no relief. Pt has hx of heart problems but unsure of what they actually are. Pt has a pacemaker/defib, also has a picc line in place for a heart medication that he is supposed to receive 3 xs a week. Pt states he last received his medication a month ago.

## 2014-04-28 NOTE — ED Provider Notes (Signed)
  Physical Exam  BP 118/87  Pulse 76  Temp(Src) 98.8 F (37.1 C) (Oral)  Resp 26  Ht 5\' 11"  (1.803 m)  Wt 205 lb (92.987 kg)  BMI 28.60 kg/m2  SpO2 98%  Physical Exam  ED Course  Procedures  Care assumed at sign out. Patient has hx of end stage CHF here with CP, SOB. Recently admitted for similar symptoms. Cardiology consulted and recommend lasix. Given that he was recently admitted to hospitalist service and has social issues, will readmit to hospitalist service with cardiology consult.      Richardean Canal, MD 04/28/14 [phone]

## 2014-04-28 NOTE — H&P (Addendum)
History and Physical       Hospital Admission Note Date: 04/28/2014  Patient name: Tim RamsayDavid Card Medical record number: [id number] Date of birth: 05/18/1966 Age: 48 y.o. Gender: male  PCP: [place], [place] [facility]  Primary Cardiologist: Dr. 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 Patient reports that he gets milrinone infusions 3 times in [place], [place] and he has not received milrinone in the last 2 weeks. Patient is [place], his daughter and cannot afford to go home until he receives his next paycheck next month. He describes orthopnea with PND and has been unable to lay flat at night and wakes up short of breath. He has chronic shortness of breath, chest tightness which is improving after Lasix in the ED. He is ongoing smoker, marijuana use.   Patient was also noticed to be wheezing in the ED.     Review of Systems:  Constitutional: Denies fever, chills, diaphoresis, poor appetite and fatigue.  HEENT: Denies photophobia, eye pain, redness, hearing loss, ear pain, congestion, sore throat, rhinorrhea, sneezing, mouth sores, trouble swallowing, neck pain, neck stiffness and tinnitus.   Respiratory:  expiratory wheezing but no fevers, chills or productive cough  Cardiovascular: please see history of present illness  Gastrointestinal: Denies nausea, vomiting, abdominal pain, diarrhea,  constipation, blood in stool and abdominal distention.  Genitourinary: Denies dysuria, urgency, frequency, hematuria, flank pain and difficulty urinating.  Musculoskeletal: Denies myalgias, back pain, joint swelling, arthralgias and gait problem.  Skin: Denies pallor, rash and wound.  Neurological: Denies dizziness, seizures, syncope, weakness, light-headedness, numbness and headaches.  Hematological: Denies adenopathy. Easy bruising, personal or family bleeding history  Psychiatric/Behavioral: Denies suicidal ideation, mood changes, confusion, nervousness, sleep disturbance and agitation  Past Medical History: Past Medical History  Diagnosis Date  . Hypertension   . MI (myocardial infarction) 2007  . Bronchitis   . Pacemaker   . Dysrhythmia   . CHF (congestive heart failure)   . Sleep apnea    Past Surgical History  Procedure Laterality Date  . Tonsillectomy    . Insert / replace / remove pacemaker      Medications: Prior to Admission medications   Medication Sig Start Date End Date Taking? Authorizing Provider  albuterol (PROVENTIL) (2.5 MG/3ML) 0.083% nebulizer solution Take 2.5 mg by nebulization every 6 (six) hours as needed for wheezing or shortness of breath.   Yes Historical Provider, MD  amLODipine (NORVASC) 10 MG tablet Take 10 mg by mouth daily.   Yes Historical Provider, MD  amoxicillin-clavulanate (AUGMENTIN) 500-125 MG per tablet Take 1 tablet by mouth every 8 (eight) hours. Take for 7 days only. Patient is unsure when he started course of medication. Patient has 15 pills in bottle as of 04-20-14   Yes Historical Provider, MD  atorvastatin (LIPITOR) 40 MG tablet Take 40 mg by mouth daily.   Yes Historical Provider, MD  benazepril (LOTENSIN) 40 MG tablet Take 40 mg by mouth daily.   Yes Historical Provider, MD  digoxin (LANOXIN) 0.125 MG tablet Take 0.125 mg by  mouth daily.   Yes Historical Provider, MD  doxycycline (VIBRA-TABS) 100 MG tablet Take 100 mg by mouth every 12  (twelve) hours.   Yes Historical Provider, MD  furosemide (LASIX) 40 MG tablet Take 40 mg by mouth.   Yes Historical Provider, MD  isosorbide mononitrate (IMDUR) 30 MG 24 hr tablet Take 30 mg by mouth daily.   Yes Historical Provider, MD  magnesium oxide (MAG-OX) 400 MG tablet Take 400 mg by mouth 2 (two) times daily.   Yes Historical Provider, MD  omeprazole (PRILOSEC) 20 MG capsule Take 20 mg by mouth daily.   Yes Historical Provider, MD  potassium chloride (K-DUR) 10 MEQ tablet Take 10 mEq by mouth 2 (two) times daily.   Yes Historical Provider, MD  rivaroxaban (XARELTO) 20 MG TABS tablet Take 20 mg by mouth daily with supper.   Yes Historical Provider, MD  sertraline (ZOLOFT) 50 MG tablet Take 50 mg by mouth daily.   Yes Historical Provider, MD    Allergies:   Allergies  Allergen Reactions  . Vicodin [Hydrocodone-Acetaminophen]     sick    Social History:  reports that he has been smoking Cigarettes.  He has a 7.5 pack-year smoking history. He does not have any smokeless tobacco history on file. He reports that he drinks alcohol. He reports that he uses illicit drugs (Marijuana).  Family History: Family History  Problem Relation Age of Onset  . Heart disease Father   . Heart disease Sister   . Heart disease Mother     Physical Exam: Blood pressure 118/87, pulse 76, temperature 98.8 F (37.1 C), temperature source Oral, resp. rate 26, height 5\' 11"  (1.803 m), weight 92.987 kg (205 lb), SpO2 98.00%. General: Alert, awake, oriented x3, in no acute distress. HEENT: normocephalic, atraumatic, anicteric sclera, pink conjunctiva, pupils equal and reactive to light and accomodation, oropharynx clear Neck: supple, no masses or lymphadenopathy, no goiter, no bruits  Heart:  irregularly irregular, no murmurs  Lungs:  bilateral expiratory wheezing with bibasilar rales  Abdomen: Soft, nontender, nondistended, positive bowel sounds, no masses. Extremities: No clubbing, cyanosis, trace  edema with positive pedal pulses. Neuro: Grossly intact, no focal neurological deficits, strength 5/5 upper and lower extremities bilaterally Psych: alert and oriented x 3, normal mood and affect Skin: no rashes or lesions, warm and dry   LABS on Admission:  Basic Metabolic Panel:  Recent Labs Lab 04/28/14 0432  NA 146  K 3.3*  CL 107  CO2 26  GLUCOSE 91  BUN 16  CREATININE 0.95  CALCIUM 9.1   Liver Function Tests: No results found for this basename: AST, ALT, ALKPHOS, BILITOT, PROT, ALBUMIN,  in the last 168 hours No results found for this basename: LIPASE, AMYLASE,  in the last 168 hours No results found for this basename: AMMONIA,  in the last 168 hours CBC:  Recent Labs Lab 04/28/14 0432  WBC 5.5  HGB 9.3*  HCT 30.4*  MCV 73.1*  PLT 240   Cardiac Enzymes:  Recent Labs Lab 04/28/14 0811  TROPONINI <0.30   BNP: No components found with this basename: POCBNP,  CBG: No results found for this basename: GLUCAP,  in the last 168 hours   Radiological Exams on Admission: Dg Chest 2 View  04/28/2014   CLINICAL DATA:  Shortness of breath and chest pain with history of defibrillator placement and tobacco use  EXAM: CHEST  2 VIEW  COMPARISON:  Portable chest x-ray of April 21, 2014  FINDINGS: The cardiopericardial  silhouette remains enlarged. The pulmonary vascularity remains engorged. The interstitial markings remain mildly increased bilaterally. There is no alveolar infiltrate or pleural effusion. The permanent pacemaker defibrillator is unchanged. The visualized bony thorax is unremarkable.  IMPRESSION: Low-grade CHF with mild interstitial edema.   Electronically Signed   By: Osamah  [place]   On: 04/28/2014 07:52   Dg Chest Port 1 View  04/21/2014   CLINICAL DATA:  PICC line insertion.  EXAM: PORTABLE CHEST - 1 VIEW  COMPARISON:  04/20/2014.  FINDINGS: Cardiomegaly. Cardiac pacer noted with lead tips in the right atrium and right ventricle. Pulmonary vascularity normal.  PICC line has been advanced. Its tip is at the cavoatrial junction. Lungs are clear. No pleural effusion or pneumothorax.  IMPRESSION: 1. Interim advancement of PICC line. PICC line tip is at the cavoatrial junction. 2. Severe cardiomegaly.  No CHF.  Cardiac pacer in stable position.   Electronically Signed   By: Maisie Fus  Register   On: 04/21/2014 16:18   Dg Chest Portable 1 View  04/20/2014   CLINICAL DATA:  CHEST PAIN picc line placement  EXAM: PORTABLE CHEST - 1 VIEW  COMPARISON:  None.  FINDINGS: Left subclavian AICD. Moderate cardiomegaly. Right arm PICC extends to the level of the right brachiocephalic vein. Lungs clear. No effusion. Regional bones unremarkable.  IMPRESSION: 1. Cardiomegaly. 2. Right arm PICC to right brachiocephalic vein.   Electronically Signed   By: Oley Balm M.D.   On: 04/20/2014 21:17    Assessment/Plan Principal Problem:   Acute on chronic combined systolic and diastolic CHF (congestive heart failure), NYHA class IV: Secondary to noncompliance with medication management. BNP 4523  - Will admit to telemetry, rule out for acute ACS  - Recent echocardiogram 8/6 showed EF of 15% with severe global hypokinesis, moderate to severe MR  - Discussed with cardiology recommended increasing Lasix, placed on CHF pathway, Lasix 60 mg IV twice a day, continue Imdur, benazepril, digoxin - Not an ideal candidate for milrinone due to his noncompliance and social situation. Dr. Delton See will contact CHF team. Patient has followup appointment with heart failure clinic on 8/19 at 9;45 a.m. - Check urine drug screen  Active Problems:   Chest pain: Atypical likely due to acute on chronic CHF, COPD exacerbation, he does have ischemic cardiopathy with EF of 15% - Per cardiology no ischemic workup at this time, continue diuresis, Imdur, ACE inhibitor, statin      A-fib: Rate controlled - Continue digoxin, xarelto    HTN (hypertension); restart outpatient cardiac medications    Non  compliance w medication regimen: - Patient counseled on medication and dietary compliance.    Tobacco abuse - Placed on Nicoderm patch    Acute bronchiolitis - Placed on Xopenex/Atrovent nebs, IV Solu-Medrol, Levaquin   DVT prophylaxis: xarelto  CODE STATUS: Full code  Family Communication: Admission, patients condition and plan of care including tests being ordered have been discussed with the patient  who indicates understanding and agree with the plan and Code Status   Further plan will depend as patient's clinical course evolves and further radiologic and laboratory data become available.   Time Spent on Admission: 1 hour  Peri Kreft M.D. [facility] 04/28/2014, 10:25 AM Pager: [phone]  If 7PM-7AM, please contact night-coverage [email or web address] Password [email or web address]  **Disclaimer: This note was dictated with voice recognition software. Similar sounding words can inadvertently be transcribed and this note may contain transcription errors which may not have been corrected upon publication of note.**

## 2014-04-28 NOTE — ED Notes (Signed)
Attempted report 

## 2014-04-28 NOTE — Care Management Note (Addendum)
    Page 1 of 2   05/01/2014     9:08:00 AM CARE MANAGEMENT NOTE 05/01/2014  Patient:  [facility]   Account Number:  [id number]  Date Initiated:  04/28/2014  Documentation initiated by:  Mariann Laster  Subjective/Objective Assessment:   Chest Pain, CHF     Action/Plan:   CM to follow for disposition needs   Anticipated DC Date:  05/01/2014   Anticipated DC Plan:  [place]  CM consult      Choice offered to / List presented to:          [facility] arranged  HH-1 RN      [place].   Status of service:  Completed, signed off Medicare Important Message given?  YES (If response is "NO", the following Medicare IM given date fields will be blank) Date Medicare IM given:  04/29/2014 Medicare IM given by:  HUTCHINSON,CRYSTAL Date Additional Medicare IM given:   Additional Medicare IM given by:    Discharge Disposition:  [place]  Per UR Regulation:  Reviewed for med. necessity/level of care/duration of stay  If discussed at [place] of Stay Meetings, dates discussed:    Comments:  04/30/1513:00 CM met with pt's wife(ex?) who states pt will be staying at his daughter-in-laws house: [address] [place], [place] [address] [phone].  Pt is illiterate. CM called referral to [facility] rep, Stephanie for [facility].  No other CM needs were ommunicated.  Mariane Masters, BSN, CM [phone]. 09:45 CM met with pt who states his insurance is active but he has other expenses.  CM encouraged pt to prioritize his health and utilize the family resources he has at this time.  No other CM needs were communicated. Mariane Masters, BSN, CM [phone].  08:00 CM notes CM consult for medication needs.  Pt has Medicare A&B and Medicaid and does not qualify for MATCH. MD encouraged to write as many prescriptions from $4 dollar list which are appropriate.  Pt has stepdaughter for support.  No other CM needs were communicated.   Mariane Masters, BSN, [place].  Crystal Hutchinson RN, BSN, MSHL, CCM  Nurse - Case Manager,  ([facility])  [phone]  04/29/2014 IM signed by patient and placed in chart 04/29/2014 Chest pain with shortness of breath, CHF Secondary to noncompliance with medication management;  R/o ACS Hx/o 04/20/14-04/21/14 OBSERVATION   Chest pain,   advanced heart issues, Chronic systolic congestive heart failure, NYHA class 4.  Hx/o  missing  milrinone. Acute ACS ruled out and d/c at baseline. Social:   Patient is from [place], [place], currently visiting [place] and staying with [facility] [employer]. Patient plans to return back to [facility] when able to afford transportation. PCP: Unknown and patient unable to provide name Primary Cardiologist: Dr. Lynnae January, [facility], [place] PT/OT eval: pending Disposition Plan:  Pending

## 2014-04-28 NOTE — ED Notes (Signed)
Pt states he was walking to the store when his cp started around 5pm yesterday evening. Pt states pain was intermittent and then went away completely, then came back this morning.

## 2014-04-28 NOTE — ED Provider Notes (Signed)
CSN: [id number]     Arrival date & time 04/28/14  0354 History   First MD Initiated Contact with Patient 04/28/14 0425     Chief Complaint  Patient presents with  . Chest Pain     (Consider location/radiation/quality/duration/timing/severity/associated sxs/prior Treatment) Patient is a 48 y.o. male presenting with chest pain. The history is provided by the patient.  Chest Pain He had onset about 6 PM of chest pressure with associated dyspnea. He rates the pressure feeling it 10/10. Nothing makes it better nothing makes it worse. Has history of ischemic cardiomyopathy and has had similar pain before and was admitted to the hospital by one week ago with similar pain. He denies nausea, vomiting, diaphoresis. Nothing makes symptoms better nothing makes it worse. He has not taken anything for pain.  Past Medical History  Diagnosis Date  . Hypertension   . MI (myocardial infarction) 2007  . Bronchitis   . Pacemaker   . Dysrhythmia   . CHF (congestive heart failure)   . Sleep apnea    Past Surgical History  Procedure Laterality Date  . Tonsillectomy    . Insert / replace / remove pacemaker     Family History  Problem Relation Age of Onset  . Heart disease Father   . Heart disease Sister   . Heart disease Mother    History  Substance Use Topics  . Smoking status: Current Every Day Smoker -- 0.50 packs/day for 15 years    Types: Cigarettes  . Smokeless tobacco: Not on file  . Alcohol Use: Yes     Comment: occasional    Review of Systems  Cardiovascular: Positive for chest pain.  All other systems reviewed and are negative.     Allergies  Vicodin  Home Medications   Prior to Admission medications   Medication Sig Start Date End Date Taking? Authorizing Provider  albuterol (PROVENTIL) (2.5 MG/3ML) 0.083% nebulizer solution Take 2.5 mg by nebulization every 6 (six) hours as needed for wheezing or shortness of breath.   Yes Historical Provider, MD  amLODipine (NORVASC)  10 MG tablet Take 10 mg by mouth daily.   Yes Historical Provider, MD  amoxicillin-clavulanate (AUGMENTIN) 500-125 MG per tablet Take 1 tablet by mouth every 8 (eight) hours. Take for 7 days only. Patient is unsure when he started course of medication. Patient has 15 pills in bottle as of 04-20-14   Yes Historical Provider, MD  atorvastatin (LIPITOR) 40 MG tablet Take 40 mg by mouth daily.   Yes Historical Provider, MD  benazepril (LOTENSIN) 40 MG tablet Take 40 mg by mouth daily.   Yes Historical Provider, MD  digoxin (LANOXIN) 0.125 MG tablet Take 0.125 mg by mouth daily.   Yes Historical Provider, MD  doxycycline (VIBRA-TABS) 100 MG tablet Take 100 mg by mouth every 12 (twelve) hours.   Yes Historical Provider, MD  furosemide (LASIX) 40 MG tablet Take 40 mg by mouth.   Yes Historical Provider, MD  isosorbide mononitrate (IMDUR) 30 MG 24 hr tablet Take 30 mg by mouth daily.   Yes Historical Provider, MD  magnesium oxide (MAG-OX) 400 MG tablet Take 400 mg by mouth 2 (two) times daily.   Yes Historical Provider, MD  omeprazole (PRILOSEC) 20 MG capsule Take 20 mg by mouth daily.   Yes Historical Provider, MD  potassium chloride (K-DUR) 10 MEQ tablet Take 10 mEq by mouth 2 (two) times daily.   Yes Historical Provider, MD  rivaroxaban (XARELTO) 20 MG TABS tablet Take 20  mg by mouth daily with supper.   Yes Historical Provider, MD  sertraline (ZOLOFT) 50 MG tablet Take 50 mg by mouth daily.   Yes Historical Provider, MD   BP 134/82  Pulse 78  Temp(Src) 98.8 F (37.1 C) (Oral)  Resp 30  Ht 5\' 11"  (1.803 m)  Wt 205 lb (92.987 kg)  BMI 28.60 kg/m2  SpO2 99% Physical Exam  Nursing note and vitals reviewed.  48 year old male, resting comfortably and in no acute distress. Vital signs are significant for tachypnea. Oxygen saturation is 99%, which is normal. Head is normocephalic and atraumatic. PERRLA, EOMI. Oropharynx is clear. Neck is nontender and supple without adenopathy or JVD. Back is  nontender and there is no CVA tenderness. Lungs are clear without rales, wheezes, or rhonchi. Chest is nontender. Heart has regular rate and rhythm without murmur. Abdomen is soft, flat, nontender without masses or hepatosplenomegaly and peristalsis is normoactive. Extremities have no cyanosis or edema, full range of motion is present. Skin is warm and dry without rash. Neurologic: Mental status is normal, cranial nerves are intact, there are no motor or sensory deficits.  ED Course  Procedures (including critical care time) Labs Review Results for orders placed during the hospital encounter of 04/28/14  CBC      Result Value Ref Range   WBC 5.5  4.0 - 10.5 K/uL   RBC 4.16 (*) 4.22 - 5.81 MIL/uL   Hemoglobin 9.3 (*) 13.0 - 17.0 g/dL   HCT 55.3 (*) 74.8 - 27.0 %   MCV 73.1 (*) 78.0 - 100.0 fL   MCH 22.4 (*) 26.0 - 34.0 pg   MCHC 30.6  30.0 - 36.0 g/dL   RDW 78.6 (*) 75.4 - 49.2 %   Platelets 240  150 - 400 K/uL  BASIC METABOLIC PANEL      Result Value Ref Range   Sodium 146  137 - 147 mEq/L   Potassium 3.3 (*) 3.7 - 5.3 mEq/L   Chloride 107  96 - 112 mEq/L   CO2 26  19 - 32 mEq/L   Glucose, Bld 91  70 - 99 mg/dL   BUN 16  6 - 23 mg/dL   Creatinine, Ser 0.10  0.50 - 1.35 mg/dL   Calcium 9.1  8.4 - 07.1 mg/dL   GFR calc non Af Amer >90  >90 mL/min   GFR calc Af Amer >90  >90 mL/min   Anion gap 13  5 - 15  I-STAT TROPOININ, ED      Result Value Ref Range   Troponin i, poc 0.02  0.00 - 0.08 ng/mL   Comment 3            Imaging Review Dg Chest 2 View  04/28/2014   CLINICAL DATA:  Shortness of breath and chest pain with history of defibrillator placement and tobacco use  EXAM: CHEST  2 VIEW  COMPARISON:  Portable chest x-ray of April 21, 2014  FINDINGS: The cardiopericardial silhouette remains enlarged. The pulmonary vascularity remains engorged. The interstitial markings remain mildly increased bilaterally. There is no alveolar infiltrate or pleural effusion. The permanent  pacemaker defibrillator is unchanged. The visualized bony thorax is unremarkable.  IMPRESSION: Low-grade CHF with mild interstitial edema.   Electronically Signed   By: Britney  [place]   On: 04/28/2014 07:52     Date: 04/28/2014  Rate: 86  Rhythm: atrial fibrillation  QRS Axis: normal  Intervals: normal  ST/T Wave abnormalities: nonspecific ST/T changes  Conduction Disutrbances:nonspecific  intraventricular conduction delay  Narrative Interpretation: Atrial fibrillation with a ventricular paced beats. Low voltage. Nonspecific intraventricular conduction delay. Nonspecific ST and T changes. When compared with ECG of 04/21/2014, no significant changes are seen.  Old EKG Reviewed: unchanged    MDM   Final diagnoses:  Chest pain, unspecified chest pain type  Chronic atrial fibrillation  Congestive dilated cardiomyopathy  AICD (automatic cardioverter/defibrillator) present    Chest pain in a patient with known ischemic cardiomyopathy. Old records are reviewed and he had been admitted about one week ago and had rule out ACS protocol. It was noted that he has noncompliance with medication regimen. His ECG does not show any acute changes today. He'll be on aspirin and nitroglycerin and reassessed.  He had partial relief of pain with nitroglycerin. He is given a dose of morphine an attempt will be made to get him pain-free. He will need to be admitted for serial troponins. Case is signed out to Dr. Silverio LayYao.  Dione Boozeavid Maleyah Evans, MD 04/28/14 [phone]

## 2014-04-28 NOTE — Consult Note (Signed)
CARDIOLOGY CONSULT NOTE   Patient ID: Tim Reyes MRN: [id number], DOB/AGE: 1966-01-25   Admit date: 04/28/2014 Date of Consult: 04/28/2014  Primary Physician: No primary provider on file. Primary Cardiologist: Dr. Lahoma Rocker, [facility]   Reason for consult:  Chest pain   Problem List  Diagnosis Date  . Ischemic cardiomyopathy 08/14/04  Ef=15% . HLD (hyperlipidemia)  . Tobacco abuse  . PUD (peptic ulcer disease)  . Marijuana abuse  . Noncompliance with medication regimen  . Alcohol abuse  . CAD (coronary artery disease) 08/14/04  Angioplasty x 1 vessel 11/05, Cath [id number] 20% LCx & RCA  . Hypertension  . Systolic and diastolic CHF, acute on chronic  June 2012 EF = 15% by echo  . Pacemaker 2011  . Congestive heart failure, unspecified  CHF book given 11/18/2012, pt has scales 05/25/2013 Digital scales provided and usage reviewed. Provided CHF zones and weight logs  . Atrial fibrillation  . Atrial thrombus  . Cirrhosis of liver  . Cannabinosis   Past Medical History  Diagnosis Date  . Hypertension   . MI (myocardial infarction) 2007  . Bronchitis   . Pacemaker   . Dysrhythmia   . CHF (congestive heart failure)   . Sleep apnea     Past Surgical History  Procedure Laterality Date  . Tonsillectomy    . Insert / replace / remove pacemaker       Allergies  Allergies  Allergen Reactions  . Vicodin [Hydrocodone-Acetaminophen]     sick    HPI   This is a 48 y.o. male with a past medical history significant for is a 48 y.o. male with a Past Medical History of chronic systolic heart failure (last EF per care everywhere around 15% in June 2012),s/p Biotronik ICD/pacemaker in 2011, HLD, ischemic cardiomyopathy, CAD (coronary artery disease) 08/14/04 Angioplasty x 1 vessel 11/05, Cath [id number] 20% LCx & RCA , cardiac catheterization Feb 2012 20% LCx & RCA, chronic atrial fibrillation, alcohol and marijuana abuse, liver cirrhosis who comes to  the ER for the third time within the last 10 days for acute on chronic systolic CHF exacerbation.  The patient is CHF NYHA IV on intermittent Milrinone infusion 3x per week in [place], [place]. He hasn't received Milrinone in the last 2 weeks. The patient is visiting his daughter and until he gets his paycheck the next month he can't afford to go home. He states that he was complaint to his meds at home. He is an ongoing smoker. He presented today with chest pain, SOB at rest, orthopnea and PND, inability to lay flat or sleep. No palpitations or syncope.    Home meds: albuterol (2.5 MG/3ML) 0.083% nebulizer solution   Commonly known as: PROVENTIL   Take 2.5 mg by nebulization every 6 (six) hours as needed for wheezing or shortness of breath.    amLODipine 10 MG tablet   Commonly known as: NORVASC   Take 10 mg by mouth daily.    amoxicillin-clavulanate 500-125 MG per tablet   Commonly known as: AUGMENTIN   Take 1 tablet by mouth every 8 (eight) hours. Take for 7 days only. Patient is unsure when he started course of medication. Patient has 15 pills in bottle as of 04-20-14    atorvastatin 40 MG tablet   Commonly known as: LIPITOR   Take 40 mg by mouth daily.    benazepril 40 MG tablet   Commonly known as: LOTENSIN   Take 40 mg by mouth daily.  digoxin 0.125 MG tablet   Commonly known as: LANOXIN   Take 0.125 mg by mouth daily.    doxycycline 100 MG tablet   Commonly known as: VIBRA-TABS   Take 100 mg by mouth every 12 (twelve) hours.    furosemide 40 MG tablet   Commonly known as: LASIX   Take 40 mg by mouth.    isosorbide mononitrate 30 MG 24 hr tablet   Commonly known as: IMDUR   Take 30 mg by mouth daily.    magnesium oxide 400 MG tablet   Commonly known as: MAG-OX   Take 400 mg by mouth 2 (two) times daily.    omeprazole 20 MG capsule   Commonly known as: PRILOSEC   Take 20 mg by mouth daily.    potassium chloride 10 MEQ tablet   Commonly known as: K-DUR   Take 10  mEq by mouth 2 (two) times daily.    rivaroxaban 20 MG Tabs tablet   Commonly known as: XARELTO   Take 20 mg by mouth daily with supper.    sertraline 50 MG tablet   Commonly known as: ZOLOFT   Take 50 mg by mouth daily.      Inpatient Medications  . aspirin  325 mg Oral STAT   Family History Family History  Problem Relation Age of Onset  . Heart disease Father   . Heart disease Sister   . Heart disease Mother      Social History History   Social History  . Marital Status: Single    Spouse Name: N/A    Number of Children: N/A  . Years of Education: N/A   Occupational History  . Not on file.   Social History Main Topics  . Smoking status: Current Every Day Smoker -- 0.50 packs/day for 15 years    Types: Cigarettes  . Smokeless tobacco: Not on file  . Alcohol Use: Yes     Comment: occasional  . Drug Use: Yes    Special: Marijuana  . Sexual Activity: Not on file   Other Topics Concern  . Not on file   Social History Narrative  . No narrative on file     Review of Systems  General:  No chills, fever, night sweats or weight changes.  Cardiovascular:  No chest pain, dyspnea on exertion, edema, orthopnea, palpitations, paroxysmal nocturnal dyspnea. Dermatological: No rash, lesions/masses Respiratory: No cough, dyspnea Urologic: No hematuria, dysuria Abdominal:   No nausea, vomiting, diarrhea, bright red blood per rectum, melena, or hematemesis Neurologic:  No visual changes, wkns, changes in mental status. All other systems reviewed and are otherwise negative except as noted above.  Physical Exam  Blood pressure 118/87, pulse 76, temperature 98.8 F (37.1 C), temperature source Oral, resp. rate 26, height 5\' 11"  (1.803 m), weight 205 lb (92.987 kg), SpO2 98.00%.  General: Pleasant, NAD Psych: Normal affect. Neuro: Alert and oriented X 3. Moves all extremities spontaneously. HEENT: Normal  Neck: Supple without bruits, JVDs up to the jaws,  Lungs:  Resp  regular and unlabored, rales B/L, wheezing Heart: irregular no s3, s4, or murmurs. Abdomen: Soft, non-tender, non-distended, BS + x 4.  Extremities: No clubbing, cyanosis, minimal edema B/L. DP/PT/Radials 2+ and equal bilaterally.  Labs  No results found for this basename: CKTOTAL, CKMB, TROPONINI,  in the last 72 hours Lab Results  Component Value Date   WBC 5.5 04/28/2014   HGB 9.3* 04/28/2014   HCT 30.4* 04/28/2014   MCV 73.1* 04/28/2014  PLT 240 04/28/2014    Recent Labs Lab 04/28/14 0432  NA 146  K 3.3*  CL 107  CO2 26  BUN 16  CREATININE 0.95  CALCIUM 9.1  GLUCOSE 91   Radiology/Studies  Dg Chest 2 View  04/28/2014   CLINICAL DATA:  Shortness of breath and chest pain with history of defibrillator placement and tobacco use  EXAM: CHEST  2 VIEW  COMPARISON:  Portable chest x-ray of April 21, 2014  FINDINGS: The cardiopericardial silhouette remains enlarged. The pulmonary vascularity remains engorged. The interstitial markings remain mildly increased bilaterally. There is no alveolar infiltrate or pleural effusion. The permanent pacemaker defibrillator is unchanged. The visualized bony thorax is unremarkable.  IMPRESSION: Low-grade CHF with mild interstitial edema.     Echocardiogram - 04/28/2014 Left ventricle: The cavity size was severely dilated. Wall thickness was increased in a pattern of mild LVH. The estimated ejection fraction was 15%. Severe global hypokinesis. There is probably restrictive diastolic function with elevated filling pressure, however, given underlying a-fib, this cannot be accurately quanitified. - Aorta: Aortic root dimension: 40 mm (ED). - Aortic root: The aortic root is dilated. - Mitral valve: Mildly thickened leaflets . Moderate to severe posteriorly directed regurgitation. - Left atrium: Severely dilated (56 ml/m2). - Right ventricle: The cavity size was mildly dilated. Reduced systolic function. - Right atrium: Massively dilated (50 cm2). -  Atrial septum: No defect or patent foramen ovale was identified. - Tricuspid valve: Leaflets malcoapt due to ICD wires. There is severe regurgitation with sinusoidal flow in the hepatic veins. - Pulmonary arteries: Dilated. PA peak pressure: 57 mm Hg (S).  Impressions:  - Dilated cardiomyopathy, with severe global hypokinesis, EF 15%, AICD wires in place, massive biatrial enlagement. Moderate to severe MR, severe TR, plethoric IVC, elevated L and R heart filling pressures, at least moderate pulmonary hypertension.   ECG: V-paced, underlying rhythm a-fib     ASSESSMENT AND PLAN   1. Acute on chronic systolic CHF, NYHA IV, missed milrinone session in the sessions in last 2 weeks. He is significantly fluid overloaded, he was given 80 mg iv lasix x 1. We will continue Lasix 60 mg iv BID and monitor Crea, baseline 0.95. Continue Imdur, benazepril. He is not an ideal milrinone candidate since he is non-compliant and his social situation is difficult. We will try to increase is Lasix doses and see how he responses. I contacted HF clinic but HF social worker in out of town till next week. He will be followed in HF clinic on Wednesday 05/04/2014 at 9:45 am.  2. CAD, ischemic CMP, LVEF 15%, I don't recommend ischemic work up. Continue ACEI, atorvastatin  3. A-fib - on Xarelto  4. COPD exacerbation - start Xopenex    Signed, Lars MassonNELSON, Obaloluwa Delatte H, MD, [facility] 04/28/2014, 8:45 AM

## 2014-04-28 NOTE — Progress Notes (Addendum)
UR completed Kesley Mullens K. Abdinasir Spadafore, RN, BSN, MSHL, CCM  04/28/2014 2:37 PM

## 2014-04-29 DIAGNOSIS — I2510 Atherosclerotic heart disease of native coronary artery without angina pectoris: Secondary | ICD-10-CM | POA: Diagnosis present

## 2014-04-29 DIAGNOSIS — I252 Old myocardial infarction: Secondary | ICD-10-CM | POA: Diagnosis not present

## 2014-04-29 DIAGNOSIS — Z91199 Patient's noncompliance with other medical treatment and regimen due to unspecified reason: Secondary | ICD-10-CM | POA: Diagnosis not present

## 2014-04-29 DIAGNOSIS — I5043 Acute on chronic combined systolic (congestive) and diastolic (congestive) heart failure: Secondary | ICD-10-CM | POA: Diagnosis present

## 2014-04-29 DIAGNOSIS — K7030 Alcoholic cirrhosis of liver without ascites: Secondary | ICD-10-CM | POA: Diagnosis present

## 2014-04-29 DIAGNOSIS — Z95810 Presence of automatic (implantable) cardiac defibrillator: Secondary | ICD-10-CM | POA: Diagnosis not present

## 2014-04-29 DIAGNOSIS — I4891 Unspecified atrial fibrillation: Secondary | ICD-10-CM | POA: Diagnosis present

## 2014-04-29 DIAGNOSIS — I2589 Other forms of chronic ischemic heart disease: Secondary | ICD-10-CM | POA: Diagnosis present

## 2014-04-29 DIAGNOSIS — I509 Heart failure, unspecified: Secondary | ICD-10-CM | POA: Diagnosis present

## 2014-04-29 DIAGNOSIS — J440 Chronic obstructive pulmonary disease with acute lower respiratory infection: Secondary | ICD-10-CM | POA: Diagnosis present

## 2014-04-29 DIAGNOSIS — Z598 Other problems related to housing and economic circumstances: Secondary | ICD-10-CM | POA: Diagnosis not present

## 2014-04-29 DIAGNOSIS — Z9119 Patient's noncompliance with other medical treatment and regimen: Secondary | ICD-10-CM | POA: Diagnosis not present

## 2014-04-29 DIAGNOSIS — F1020 Alcohol dependence, uncomplicated: Secondary | ICD-10-CM | POA: Diagnosis present

## 2014-04-29 DIAGNOSIS — E785 Hyperlipidemia, unspecified: Secondary | ICD-10-CM | POA: Diagnosis present

## 2014-04-29 DIAGNOSIS — K761 Chronic passive congestion of liver: Secondary | ICD-10-CM | POA: Diagnosis present

## 2014-04-29 DIAGNOSIS — I10 Essential (primary) hypertension: Secondary | ICD-10-CM | POA: Diagnosis present

## 2014-04-29 DIAGNOSIS — Z5987 Material hardship: Secondary | ICD-10-CM | POA: Diagnosis not present

## 2014-04-29 DIAGNOSIS — F17200 Nicotine dependence, unspecified, uncomplicated: Secondary | ICD-10-CM

## 2014-04-29 DIAGNOSIS — F1210 Cannabis abuse, uncomplicated: Secondary | ICD-10-CM | POA: Diagnosis present

## 2014-04-29 LAB — HEPATIC FUNCTION PANEL
ALBUMIN: 3.6 g/dL (ref 3.5–5.2)
ALT: 11 U/L (ref 0–53)
AST: 24 U/L (ref 0–37)
Alkaline Phosphatase: 82 U/L (ref 39–117)
Bilirubin, Direct: 0.6 mg/dL — ABNORMAL HIGH (ref 0.0–0.3)
Indirect Bilirubin: 3.4 mg/dL — ABNORMAL HIGH (ref 0.3–0.9)
TOTAL PROTEIN: 7.2 g/dL (ref 6.0–8.3)
Total Bilirubin: 4 mg/dL — ABNORMAL HIGH (ref 0.3–1.2)

## 2014-04-29 LAB — LIPASE, BLOOD: Lipase: 15 U/L (ref 11–59)

## 2014-04-29 LAB — BASIC METABOLIC PANEL
Anion gap: 16 — ABNORMAL HIGH (ref 5–15)
BUN: 17 mg/dL (ref 6–23)
CHLORIDE: 101 meq/L (ref 96–112)
CO2: 26 meq/L (ref 19–32)
Calcium: 9.1 mg/dL (ref 8.4–10.5)
Creatinine, Ser: 0.96 mg/dL (ref 0.50–1.35)
GFR calc Af Amer: >90 mL/min (ref >90)
GFR calc non Af Amer: >90 mL/min (ref >90)
GLUCOSE: 165 mg/dL — AB (ref 70–99)
POTASSIUM: 3.5 meq/L — AB (ref 3.7–5.3)
Sodium: 143 mEq/L (ref 137–147)

## 2014-04-29 LAB — TSH: TSH: 1.24 u[IU]/mL (ref 0.350–4.500)

## 2014-04-29 MED ORDER — FUROSEMIDE 40 MG PO TABS
40.0000 mg | ORAL_TABLET | Freq: Two times a day (BID) | ORAL | Status: DC
  Administered 2014-04-29 – 2014-04-30 (×3): 40 mg via ORAL
  Filled 2014-04-29 (×5): qty 1

## 2014-04-29 MED ORDER — PANTOPRAZOLE SODIUM 40 MG PO TBEC
40.0000 mg | DELAYED_RELEASE_TABLET | Freq: Two times a day (BID) | ORAL | Status: DC
  Administered 2014-04-30: 40 mg via ORAL
  Filled 2014-04-29: qty 1

## 2014-04-29 MED ORDER — PREDNISONE 50 MG PO TABS
60.0000 mg | ORAL_TABLET | Freq: Every day | ORAL | Status: DC
  Administered 2014-04-29 – 2014-04-30 (×2): 60 mg via ORAL
  Filled 2014-04-29 (×3): qty 1

## 2014-04-29 MED ORDER — LEVALBUTEROL HCL 0.63 MG/3ML IN NEBU: 0.6300 mg | INHALATION_SOLUTION | Freq: Four times a day (QID) | RESPIRATORY_TRACT | Status: DC | PRN

## 2014-04-29 MED ORDER — IPRATROPIUM BROMIDE 0.02 % IN SOLN
0.5000 mg | Freq: Three times a day (TID) | RESPIRATORY_TRACT | Status: DC
  Administered 2014-04-29: 0.5 mg via RESPIRATORY_TRACT
  Filled 2014-04-29: qty 2.5

## 2014-04-29 MED ORDER — POTASSIUM CHLORIDE ER 10 MEQ PO TBCR
40.0000 meq | EXTENDED_RELEASE_TABLET | Freq: Two times a day (BID) | ORAL | Status: AC
  Administered 2014-04-29 (×2): 40 meq via ORAL
  Filled 2014-04-29 (×3): qty 4

## 2014-04-29 MED ORDER — LEVALBUTEROL HCL 0.63 MG/3ML IN NEBU
0.6300 mg | INHALATION_SOLUTION | Freq: Three times a day (TID) | RESPIRATORY_TRACT | Status: DC
  Administered 2014-04-29: 0.63 mg via RESPIRATORY_TRACT
  Filled 2014-04-29 (×4): qty 3

## 2014-04-29 MED ORDER — GI COCKTAIL ~~LOC~~
30.0000 mL | Freq: Three times a day (TID) | ORAL | Status: DC | PRN
  Administered 2014-04-29: 30 mL via ORAL
  Filled 2014-04-29: qty 30

## 2014-04-29 NOTE — Progress Notes (Signed)
UR Completed Rye Dorado Graves-Bigelow, RN,BSN [phone]  

## 2014-04-29 NOTE — Progress Notes (Addendum)
    Subjective:  SOB improved though he still has a non productive cough  Objective:  Vital Signs in the last 24 hours: Temp:  [97.9 F (36.6 C)-98.5 F (36.9 C)] 98.4 F (36.9 C) (08/14 0443) Pulse Rate:  [82-92] 82 (08/14 0443) Resp:  [18-22] 20 (08/14 0443) BP: (107-136)/(70-102) 130/89 mmHg (08/14 0443) SpO2:  [95 %-100 %] 98 % (08/14 0443) Weight:  [188 lb 7.9 oz (85.5 kg)] 188 lb 7.9 oz (85.5 kg) (08/14 0443)  Intake/Output from previous day:  Intake/Output Summary (Last 24 hours) at 04/29/14 0824 Last data filed at 04/29/14 0444  Gross per 24 hour  Intake    740 ml  Output   3575 ml  Net  -2835 ml    Physical Exam: General appearance: alert, cooperative, no distress and Exopthalmus noted Lungs: scattered rhonchi Heart: irregularly irregular rhythm Extremities: no edema   Rate: 76  Rhythm: atrial fibrillation and pacing on demand  Lab Results:  Recent Labs  04/28/14 0432  WBC 5.5  HGB 9.3*  PLT 240    Recent Labs  04/28/14 2250 04/29/14 0429  NA 140 143  K 3.6* 3.5*  CL 101 101  CO2 23 26  GLUCOSE 166* 165*  BUN 17 17  CREATININE 0.92 0.96    Recent Labs  04/28/14 1700 04/28/14 2250  TROPONINI <0.30 <0.30   No results found for this basename: INR,  in the last 72 hours  Imaging: Imaging results have been reviewed  Cardiac Studies:  Assessment/Plan:  48 y.o. male with a Past Medical History of chronic systolic heart failure (last EF per care everywhere around 15% in June 2012),s/p Biotronik ICD/pacemaker in 2011, HLD, ischemic cardiomyopathy, CAD (coronary artery disease) 08/14/04 Angioplasty x 1 vessel 11/05, cardiac catheterization Feb 2012 20% LCx & RCA, chronic atrial fibrillation, alcohol and marijuana abuse, liver cirrhosis who comes to the ER for the third time within the last 10 days for acute on chronic systolic CHF exacerbation. The patient is CHF NYHA IV on intermittent Milrinone infusion 3x per week in [place], [place]. He hasn't  received Milrinone in the last 2 weeks.   Principal Problem:   Acute on chronic combined systolic and diastolic CHF (congestive heart failure) Active Problems:   Chest pain   A-fib   HTN (hypertension)   CAD (coronary artery disease)   Non compliance w medication regimen   Tobacco abuse   Acute bronchiolitis    PLAN: Check TSH. The pt asked about an HIV test- will order and defer follow up with primary service.   Corine Shelter PA-C Beeper [phone] 04/29/2014, 8:24 AM  Attending Note:   The patient was seen and examined.  Agree with assessment and plan as noted above.  Changes made to the above note as needed.  He is feeling ok.  1. Acute on chronic systolic CHF:   He get Milrinone 3 times a week while at home ( 4 hour infusion) He is about at his baseline and could probably go home soon. Lungs are clear Cor:  A-fib  2. CP - he has ruled out.  No additional work up needed at this time. 3. A-fib:  Stable   Dc to home when stable from a medicine standpoint.  Agree with check ing THS - significant exopthalmos.    Vesta Mixer, Montez Hageman., MD, [facility] 04/29/2014, 8:51 AM [address],  [address] Office [phone] Pager [phone]

## 2014-04-29 NOTE — Progress Notes (Addendum)
[facility] PROGRESS NOTE  Tim Reyes FKC:[id number] DOB: [birth year] DOA: 04/28/2014  PCP: Patient is from [place], [place], currently visiting [place]  Brief HPI: [phone] with PMH as below who presented with shortness of breath and chest pain.  Past medical history:  Past Medical History  Diagnosis Date  . Hypertension   . MI (myocardial infarction) 2007  . Bronchitis   . Pacemaker   . Dysrhythmia   . CHF (congestive heart failure)   . Sleep apnea     Consultants: Cardiology  Procedures: None  Antibiotics: Levaquin 8/13-->  Subjective: Patient feels better today. Still with some left sided chest pain mainly when he coughs or takes deep breath.  Objective: Vital Signs  Filed Vitals:   04/28/14 2044 04/29/14 0024 04/29/14 0443 04/29/14 0845  BP: 135/94 136/94 130/89   Pulse: 85 92 82   Temp: 98.5 F (36.9 C) 98.4 F (36.9 C) 98.4 F (36.9 C)   TempSrc: Oral Oral Oral   Resp: 22 20 20    Height:      Weight:   85.5 kg (188 lb 7.9 oz)   SpO2: 100% 100% 98% 97%    Intake/Output Summary (Last 24 hours) at 04/29/14 0942 Last data filed at 04/29/14 0846  Gross per 24 hour  Intake   1100 ml  Output   3575 ml  Net  -2475 ml   Filed Weights   04/28/14 0416 04/29/14 0443  Weight: 92.987 kg (205 lb) 85.5 kg (188 lb 7.9 oz)    General appearance: alert, cooperative, appears stated age and no distress Resp: diminished air entry at bases. No crackles. Minimal wheezing. Chest wall: no tenderness Cardio: regular rate and rhythm, S1, S2 normal, no murmur, click, rub or gallop GI:  soft, mildly tender diffusely without rebound rigidity or guarding. Liver edge palpable. BS present.  Extremities: minimal edema bil lE Neurologic: No focal deficits.  Lab Results:  Basic Metabolic Panel:  Recent Labs Lab 04/28/14 0432 04/28/14 2250 04/29/14 0429  NA 146 140 143  K 3.3* 3.6* 3.5*  CL 107 101 101  CO2 26 23 26   GLUCOSE 91 166* 165*  BUN 16  17 17   CREATININE 0.95 0.92 0.96  CALCIUM 9.1 9.1 9.1   Liver Function Tests: No results found for this basename: AST, ALT, ALKPHOS, BILITOT, PROT, ALBUMIN,  in the last 168 hours No results found for this basename: LIPASE, AMYLASE,  in the last 168 hours No results found for this basename: AMMONIA,  in the last 168 hours CBC:  Recent Labs Lab 04/28/14 0432  WBC 5.5  HGB 9.3*  HCT 30.4*  MCV 73.1*  PLT 240   Cardiac Enzymes:  Recent Labs Lab 04/28/14 0811 04/28/14 1150 04/28/14 1700 04/28/14 2250  TROPONINI <0.30 <0.30 <0.30 <0.30   BNP (last 3 results)  Recent Labs  04/20/14 2159 04/28/14 0811  PROBNP 2601.0* 4523.0*    Studies/Results: Dg Chest 2 View  04/28/2014   CLINICAL DATA:  Shortness of breath and chest pain with history of defibrillator placement and tobacco use  EXAM: CHEST  2 VIEW  COMPARISON:  Portable chest x-ray of April 21, 2014  FINDINGS: The cardiopericardial silhouette remains enlarged. The pulmonary vascularity remains engorged. The interstitial markings remain mildly increased bilaterally. There is no alveolar infiltrate or pleural effusion. The permanent pacemaker defibrillator is unchanged. The visualized bony thorax is unremarkable.  IMPRESSION: Low-grade CHF with mild interstitial edema.   Electronically Signed   By: Manolito  [place]  On: 04/28/2014 07:52    Medications:  Scheduled: . atorvastatin  40 mg Oral Daily  . benazepril  40 mg Oral Daily  . digoxin  0.125 mg Oral Daily  . furosemide  40 mg Oral BID  . isosorbide mononitrate  30 mg Oral Daily  . levalbuterol  0.63 mg Nebulization Once  . levofloxacin  750 mg Oral Daily  . magnesium oxide  400 mg Oral BID  . nicotine  21 mg Transdermal Daily  . pantoprazole  40 mg Oral Daily  . potassium chloride  10 mEq Oral BID  . predniSONE  60 mg Oral Q breakfast  . rivaroxaban  20 mg Oral Q supper  . sertraline  50 mg Oral Daily  . sodium chloride  10-40 mL Intracatheter Q12H  . sodium  chloride  3 mL Intravenous Q12H   Continuous:  ZOX:WRUEAVPRN:sodium chloride, acetaminophen, levalbuterol, nitroGLYCERIN, ondansetron (ZOFRAN) IV, sodium chloride, sodium chloride  Assessment/Plan:  Principal Problem:   Acute on chronic combined systolic and diastolic CHF (congestive heart failure) Active Problems:   Chest pain   A-fib   HTN (hypertension)   CAD (coronary artery disease)   Non compliance w medication regimen   Tobacco abuse   Acute bronchiolitis    Acute on chronic combined systolic and diastolic CHF (congestive heart failure), NYHA class IV This was secondary to noncompliance with medication management. Patient was not taking his lasix. He appears to have financial issues. Seen by cardiology. Will change to oral Lasix. Discussed with Dr. Elease HashimotoNahser. No need for Milrinone at this time. Recent echocardiogram 8/6 showed EF of 15% with severe global hypokinesis, moderate to severe MR. Continue Imdur, benazepril, digoxin. Not an ideal candidate for milrinone due to his noncompliance and social situation. Patient has followup appointment with heart failure clinic on 8/19 at 9;45 a.m.   Acute bronchitis/COPD  Improved today. Cut back on steroids. Continue Levaquin.   Chest pain Atypical and likely due to acute on chronic CHF and COPD exacerbation. He does have ischemic cardiopathy with EF of 15%. Per cardiology no ischemic workup at this time. Continue Imdur. He has ruled out for ACS.   Vague Abdominal Pain Check LFT, Lipase. Liver enlarged probably due to hepatic congestion. If LFT's are ok this can be pursued as OP. 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   Chronic A-fib Rate controlled. Continue digoxin. Dig level was normal. Anticoagulated with Xarelto   Essential HTN Continue current regimen.   Tobacco abuse  Placed on Nicoderm patch   Code Status: Full Code  DVT Prophylaxis: On Xarelto    Family Communication: Discussed with patient. No family  available.  Disposition Plan: Anticipate DC in AM. CSW to see regarding home situation. Will need financial assistnce with meds. Consult CM. PT/OT.    LOS: 1 day   [facility] Surgery Center At HaleysvilleKRISHNAN,Quintasia Theroux  [facility] Pager [phone] 04/29/2014, 9:42 AM  If 8PM-8AM, please contact night-coverage at [email or web address], password [email or web address]   Disclaimer: This note was dictated with voice recognition software. Similar sounding words can inadvertently be transcribed and may not be corrected upon review.

## 2014-04-29 NOTE — Evaluation (Signed)
Physical Therapy Evaluation Patient Details Name: Tim RamsayDavid Veron MRN: [id number] DOB: 05/29/1966 Today's Date: 04/29/2014   History of Present Illness  Patient is a 48 y/o male admitted for chest tightness and shortness of breath. He describes orthopnea with PND and has been unable to lay flat at night and wakes up short of breath. Diagnosed with acute on chronic combined systolic and diastolic CHF, BNP 4523 on admission. PMH positive for hypertension, CAD (Angioplasty x 1 vessel 11/05, Cath [id number] 20% LCx & RCA), ischemic cardiomyopathy, EF 15%, hyperlipidemia, tobacco abuse, Clerance LavMarianne abuse, noncompliance with medication, end-stage heart failure on milrinone infusions (noncompliant with infusions) who was recently discharged on 8/6    Clinical Impression  Patient presents close to functional baseline and able to ambulate community distances without LOB. Abnormal rise in HR with exercise, however different readings obtained. Education provided on performing short bouts of activity to tolerance with frequent rest breaks. Pt does not require skilled therapy services however recommend continued mobility and ambulation while in hospital to maintain strength. Discharge from mobility stand point.    Follow Up Recommendations No PT follow up    Equipment Recommendations  None recommended by PT    Recommendations for Other Services       Precautions / Restrictions Precautions Precautions: None Restrictions Weight Bearing Restrictions: No      Mobility  Bed Mobility Overal bed mobility: Independent                Transfers Overall transfer level: Independent                  Ambulation/Gait Ambulation/Gait assistance: Independent   Assistive device: None   Gait velocity: 3.2 ft/sec Gait velocity interpretation: >2.62 ft/sec, indicative of independent community ambulator General Gait Details: Long strides noted during gait. No SOB present however RN reports HR increased to  150 bpm, pulse ox stated 94 bpm and manual HR reads 135 bpm. Asymptomatic.  Stairs            Wheelchair Mobility    Modified Rankin (Stroke Patients Only)       Balance Overall balance assessment: Independent                                           Pertinent Vitals/Pain Pain Assessment: No/denies pain (Describes burning sensation in chest today)    Home Living Family/patient expects to be discharged to:: Private residence Living Arrangements: Other relatives Available Help at Discharge: Family;Available PRN/intermittently Type of Home: House Home Access: Level entry     Home Layout: One level Home Equipment: None      Prior Function Level of Independence: Independent               Hand Dominance        Extremity/Trunk Assessment   Upper Extremity Assessment: Overall WFL for tasks assessed           Lower Extremity Assessment: Overall WFL for tasks assessed      Cervical / Trunk Assessment: Normal  Communication   Communication: No difficulties  Cognition Arousal/Alertness: Awake/alert Behavior During Therapy: WFL for tasks assessed/performed Overall Cognitive Status: Within Functional Limits for tasks assessed                      General Comments      Exercises        Assessment/Plan  PT Assessment Patent does not need any further PT services  PT Diagnosis     PT Problem List    PT Treatment Interventions     PT Goals (Current goals can be found in the Care Plan section) Acute Rehab PT Goals PT Goal Formulation: No goals set, d/c therapy    Frequency     Barriers to discharge        Co-evaluation               End of Session   Activity Tolerance: Patient tolerated treatment well (Except abnormal increase in HR with exercise.) Patient left: in bed;with call bell/phone within reach;with nursing/sitter in room (sitting EOB.) Nurse Communication: Mobility status         Time:  3500-9381 PT Time Calculation (min): 12 min   Charges:   PT Evaluation $Initial PT Evaluation Tier I: 1 Procedure     PT G CodesAlvie Heidelberg A 04/29/2014, 5:13 PM Alvie Heidelberg, PT, DPT [phone]

## 2014-04-29 NOTE — ED Provider Notes (Signed)
I saw and evaluated the patient, reviewed the resident's note and I agree with the findings and plan.   EKG Interpretation   Date/Time:  Wednesday April 20 2014 20:25:37 EDT Ventricular Rate:  100 PR Interval:    QRS Duration: 119 QT Interval:  381 QTC Calculation: 491 R Axis:   135 Text Interpretation:  Atrial flutter Aberrant conduction of SV complex(es)  Nonspecific intraventricular conduction delay Borderline low voltage,  extremity leads Repol abnrm suggests ischemia, lateral leads ED PHYSICIAN  INTERPRETATION AVAILABLE IN [facility] Confirmed by TEST, Record  ([id number]) on 04/22/2014 7:21:10 AM      Pt comes in with chest pain, atypical. Several CAd risk factors, and high HEART score - so we shall admit the patient.  Derwood Kaplan, MD 04/29/14 [phone]

## 2014-04-29 NOTE — Clinical Social Work Psychosocial (Signed)
     Clinical Social Work Department BRIEF PSYCHOSOCIAL ASSESSMENT 04/29/2014  Patient:  [facility]     Account Number:  [id number]     Admit date:  04/28/2014  Clinical Social Worker:  Tiburcio Pea  Date/Time:  04/29/2014 02:42 PM  Referred by:  Physician  Date Referred:  04/29/2014 Referred for  Other - See comment   Other Referral:   Ascertain current living arrangements   Interview type:  Patient Other interview type:    PSYCHOSOCIAL DATA Living Status:  FAMILY Admitted from facility:   Level of care:   Primary support name:  Lorinda Creed  [phone] Primary support relationship to patient:  FAMILY Degree of support available:   Nephew    CURRENT CONCERNS Current Concerns  None Noted   Other Concerns:    SOCIAL WORK ASSESSMENT / PLAN CSW asked by MD to assess pateint's home situation and if any needs are outstanding for patient. CSW spoke to patient- he is currently living in [place], [place] with his nephew and  nephew's wife. He states that this has been a sucessful arrangement thus far. Patient states that [place] 2 [place]2 -3 hours from [place].  By bus- it is about 7 1/2-8 hours.  He states that he has transportation and denies any current CSW needs.  CSW will sign off.   Assessment/plan status:  No Further Intervention Required Other assessment/ plan:   Information/referral to community resources:   None    PATIENTS/FAMILYS RESPONSE TO PLAN OF CARE: Patient is alert, oriented and pleasant. He has current family support and a safe d/c plan to return home with his nephew/family.  No further CSW needs identified.  CSW signing off.

## 2014-04-30 DIAGNOSIS — I5022 Chronic systolic (congestive) heart failure: Secondary | ICD-10-CM

## 2014-04-30 LAB — BASIC METABOLIC PANEL
Anion gap: 15 (ref 5–15)
BUN: 24 mg/dL — AB (ref 6–23)
CHLORIDE: 98 meq/L (ref 96–112)
CO2: 26 mEq/L (ref 19–32)
Calcium: 9.3 mg/dL (ref 8.4–10.5)
Creatinine, Ser: 1.08 mg/dL (ref 0.50–1.35)
GFR calc Af Amer: >90 mL/min (ref >90)
GFR calc non Af Amer: 79 mL/min — ABNORMAL LOW (ref >90)
Glucose, Bld: 151 mg/dL — ABNORMAL HIGH (ref 70–99)
POTASSIUM: 4.4 meq/L (ref 3.7–5.3)
SODIUM: 139 meq/L (ref 137–147)

## 2014-04-30 LAB — CBC
HCT: 29.3 % — ABNORMAL LOW (ref 39.0–52.0)
HEMOGLOBIN: 9.1 g/dL — AB (ref 13.0–17.0)
MCH: 22.6 pg — AB (ref 26.0–34.0)
MCHC: 31.1 g/dL (ref 30.0–36.0)
MCV: 72.9 fL — AB (ref 78.0–100.0)
Platelets: 228 10*3/uL (ref 150–400)
RBC: 4.02 MIL/uL — ABNORMAL LOW (ref 4.22–5.81)
RDW: 19.4 % — ABNORMAL HIGH (ref 11.5–15.5)
WBC: 10.9 10*3/uL — AB (ref 4.0–10.5)

## 2014-04-30 LAB — HIV ANTIBODY (ROUTINE TESTING W REFLEX): HIV 1&2 Ab, 4th Generation: NONREACTIVE

## 2014-04-30 LAB — TROPONIN I

## 2014-04-30 MED ORDER — RIVAROXABAN 20 MG PO TABS: 20.0000 mg | ORAL_TABLET | Freq: Every day | ORAL | Status: DC

## 2014-04-30 MED ORDER — HEPARIN SOD (PORK) LOCK FLUSH 100 UNIT/ML IV SOLN
250.0000 [IU] | Freq: Every day | INTRAVENOUS | Status: DC
  Administered 2014-04-30: 250 [IU]

## 2014-04-30 MED ORDER — OMEPRAZOLE 20 MG PO CPDR: 20.0000 mg | DELAYED_RELEASE_CAPSULE | Freq: Two times a day (BID) | ORAL | Status: DC

## 2014-04-30 MED ORDER — ALBUTEROL SULFATE (2.5 MG/3ML) 0.083% IN NEBU: 2.5000 mg | INHALATION_SOLUTION | Freq: Four times a day (QID) | RESPIRATORY_TRACT | Status: AC | PRN

## 2014-04-30 MED ORDER — POTASSIUM CHLORIDE ER 10 MEQ PO TBCR: 10.0000 meq | EXTENDED_RELEASE_TABLET | Freq: Two times a day (BID) | ORAL | Status: DC

## 2014-04-30 MED ORDER — DIGOXIN 125 MCG PO TABS: 0.1250 mg | ORAL_TABLET | Freq: Every day | ORAL | Status: DC

## 2014-04-30 MED ORDER — AMLODIPINE BESYLATE 10 MG PO TABS: 10.0000 mg | ORAL_TABLET | Freq: Every day | ORAL | Status: DC

## 2014-04-30 MED ORDER — FUROSEMIDE 40 MG PO TABS: 40.0000 mg | ORAL_TABLET | Freq: Two times a day (BID) | ORAL | Status: DC

## 2014-04-30 MED ORDER — LEVOFLOXACIN 750 MG PO TABS: 750.0000 mg | ORAL_TABLET | Freq: Every day | ORAL | Status: DC

## 2014-04-30 MED ORDER — SERTRALINE HCL 50 MG PO TABS: 50.0000 mg | ORAL_TABLET | Freq: Every day | ORAL | Status: DC

## 2014-04-30 MED ORDER — HEPARIN SOD (PORK) LOCK FLUSH 100 UNIT/ML IV SOLN
250.0000 [IU] | INTRAVENOUS | Status: DC | PRN
  Administered 2014-04-30: 250 [IU]
  Filled 2014-04-30: qty 3

## 2014-04-30 MED ORDER — ATORVASTATIN CALCIUM 40 MG PO TABS: 40.0000 mg | ORAL_TABLET | Freq: Every day | ORAL | Status: DC

## 2014-04-30 MED ORDER — ISOSORBIDE MONONITRATE ER 60 MG PO TB24
60.0000 mg | ORAL_TABLET | Freq: Every day | ORAL | Status: DC
Start: 2014-04-30 — End: 2014-04-30

## 2014-04-30 MED ORDER — PREDNISONE 20 MG PO TABS: ORAL_TABLET | ORAL | Status: DC

## 2014-04-30 MED ORDER — ISOSORBIDE MONONITRATE ER 60 MG PO TB24: 60.0000 mg | ORAL_TABLET | Freq: Every day | ORAL | Status: DC

## 2014-04-30 MED ORDER — BENAZEPRIL HCL 40 MG PO TABS: 40.0000 mg | ORAL_TABLET | Freq: Every day | ORAL | Status: DC

## 2014-04-30 NOTE — Progress Notes (Signed)
Patient has already walked x 2 today and covered the entire unit of 3 east, tolerated well without complaints.  Education completed re: CHF, daily weights, when to call the doctor, low sodium restrictions, and exercise progression and guidelines.  No further questions by patient, he voiced verbal understanding.   [phone] Haskel Khan RN

## 2014-04-30 NOTE — Discharge Instructions (Signed)

## 2014-04-30 NOTE — Discharge Summary (Addendum)
[facility]  Physician Discharge Summary   Patient ID: Tim RamsayDavid Reyes MRN: [id number] DOB/AGE: 48/01/1966 [age] y.o.  Admit date: 04/28/2014 Discharge date: 04/30/2014  PCP: No primary provider on file.  DISCHARGE DIAGNOSES:  Active Problems:   Chest pain   Chronic systolic congestive heart failure, NYHA class 4   A-fib   HTN (hypertension)   CAD (coronary artery disease)   Non compliance w medication regimen   Tobacco abuse   Acute bronchiolitis   RECOMMENDATIONS FOR OUTPATIENT FOLLOW UP: 1. Has appointment with HF clinic as mentioned below. 2. Compliance emphasized to patient.  DISCHARGE CONDITION: fair  Diet recommendation: Heart Healthy  Filed Weights   04/28/14 0416 04/29/14 0443 04/30/14 0521  Weight: 92.987 kg (205 lb) 85.5 kg (188 lb 7.9 oz) 86.864 kg (191 lb 8 oz)    INITIAL HISTORY: 48yo with PMH as below who presented with shortness of breath and chest pain.  Past Medical History  Diagnosis Date  . Hypertension   . MI (myocardial infarction) 2007  . Bronchitis   . Pacemaker   . Dysrhythmia   . CHF (congestive heart failure)   . Sleep apnea     Consultations:  Cardiology  Procedures:  None  HOSPITAL COURSE:   Acute on chronic combined systolic and diastolic CHF (congestive heart failure), NYHA class IV  This was secondary to noncompliance with medication management. Patient was not taking his lasix. He appears to have financial issues. Seen by cardiology. Was changed to oral Lasix. Discussed with Dr. Elease HashimotoNahser on 8/14. No need for Milrinone at this time. Recent echocardiogram 8/6 showed EF of 15% with severe global hypokinesis, moderate to severe MR. Continue Imdur, benazepril, digoxin. Not an ideal candidate for milrinone due to his noncompliance and social situation. This can be determined at his f/u appointment. Will let him go with his PICC line for now. Patient has followup appointment with heart failure clinic on 8/19 at 9:45 a.m.   Acute  bronchitis/COPD  This was also noted on admission. This has improved though he still has a cough. Steroid taper. Continue Levaquin.   Chest pain  This was atypical and likely due to acute on chronic CHF and COPD exacerbation. He does have ischemic cardiopathy with EF of 15%. Per cardiology no ischemic workup at this time. Will increase dose of Imdur. He has ruled out for ACS. PPI.  Vague Abdominal Pain/Liver Cirrhosis  Resolved. Lipase was normal. He has hyperbilirubinemia and Liver enlargement which is probably due to hepatic congestion and alcoholic cirrhosis. Bilirubin has been elevated in the past. This can be pursued as OP. Denies current alcohol abuse.  Chronic A-fib  Rate is well controlled. Continue digoxin. Dig level was normal. Anticoagulated with Xarelto   Essential HTN  Continue current regimen.   Will need medication assistance. Consulted CM for same. He is medically stable for discharge.  PERTINENT LABS:  The results of significant diagnostics from this hospitalization (including imaging, microbiology, ancillary and laboratory) are listed below for reference.     Labs: Basic Metabolic Panel:  Recent Labs Lab 04/28/14 0432 04/28/14 2250 04/29/14 0429 04/30/14 0100  NA 146 140 143 139  K 3.3* 3.6* 3.5* 4.4  CL 107 101 101 98  CO2 26 23 26 26   GLUCOSE 91 166* 165* 151*  BUN 16 17 17  24*  CREATININE 0.95 0.92 0.96 1.08  CALCIUM 9.1 9.1 9.1 9.3   Liver Function Tests:  Recent Labs Lab 04/29/14 0429  AST 24  ALT 11  ALKPHOS  82  BILITOT 4.0*  PROT 7.2  ALBUMIN 3.6    Recent Labs Lab 04/29/14 0429  LIPASE 15   CBC:  Recent Labs Lab 04/28/14 0432 04/30/14 0100  WBC 5.5 10.9*  HGB 9.3* 9.1*  HCT 30.4* 29.3*  MCV 73.1* 72.9*  PLT 240 228   Cardiac Enzymes:  Recent Labs Lab 04/28/14 0811 04/28/14 1150 04/28/14 1700 04/28/14 2250 04/30/14  TROPONINI <0.30 <0.30 <0.30 <0.30 <0.30   BNP: BNP (last 3 results)  Recent Labs   04/20/14 2159 04/28/14 0811  PROBNP 2601.0* 4523.0*    IMAGING STUDIES Dg Chest 2 View  04/28/2014   CLINICAL DATA:  Shortness of breath and chest pain with history of defibrillator placement and tobacco use  EXAM: CHEST  2 VIEW  COMPARISON:  Portable chest x-ray of April 21, 2014  FINDINGS: The cardiopericardial silhouette remains enlarged. The pulmonary vascularity remains engorged. The interstitial markings remain mildly increased bilaterally. There is no alveolar infiltrate or pleural effusion. The permanent pacemaker defibrillator is unchanged. The visualized bony thorax is unremarkable.  IMPRESSION: Low-grade CHF with mild interstitial edema.   Electronically Signed   By: Deavon  [place]   On: 04/28/2014 07:52    DISCHARGE EXAMINATION: Filed Vitals:   04/29/14 2259 04/29/14 2309 04/30/14 0143 04/30/14 0521  BP: 112/78 123/83 121/77 116/92  Pulse: 91  56 51  Temp:   98 F (36.7 C) 98.1 F (36.7 C)  TempSrc:   Oral Oral  Resp: 18 18 18 18   Height:      Weight:    86.864 kg (191 lb 8 oz)  SpO2:   95% 100%   General appearance: alert, cooperative and no distress Resp: coarse breath sounds without crackles or wheezing. Cardio: regular rate and rhythm, S1, S2 normal, no murmur, click, rub or gallop GI: soft, non-tender; bowel sounds normal; no masses,  no organomegaly Extremities: extremities normal, atraumatic, no cyanosis or edema Neurologic: Alert and oriented X 3, normal strength and tone. Normal symmetric reflexes. Normal coordination and gait  DISPOSITION: Home with family  Discharge Instructions   Call MD for:  difficulty breathing, headache or visual disturbances    Complete by:  As directed      Call MD for:  persistant dizziness or light-headedness    Complete by:  As directed      Call MD for:  persistant nausea and vomiting    Complete by:  As directed      Call MD for:  severe uncontrolled pain    Complete by:  As directed      Diet - low sodium heart healthy     Complete by:  As directed      Discharge instructions    Complete by:  As directed   Please follow up with the cardiology clinic as scheduled. Take your medications as prescribed.     Increase activity slowly    Complete by:  As directed            ALLERGIES:  Allergies  Allergen Reactions  . Vicodin [Hydrocodone-Acetaminophen]     sick    Current Discharge Medication List    START taking these medications   Details  levofloxacin (LEVAQUIN) 750 MG tablet Take 1 tablet (750 mg total) by mouth daily. Qty: 3 tablet, Refills: 0    predniSONE (DELTASONE) 20 MG tablet Take 3 tablets once daily for 3 days, then take 2 tablets once daily for 4 days, then take 1 tablet once daily for 4 days,  then stop. Qty: 21 tablet, Refills: 0      CONTINUE these medications which have CHANGED   Details  albuterol (PROVENTIL) (2.5 MG/3ML) 0.083% nebulizer solution Take 3 mLs (2.5 mg total) by nebulization every 6 (six) hours as needed for wheezing or shortness of breath. Qty: 75 mL, Refills: 2    amLODipine (NORVASC) 10 MG tablet Take 1 tablet (10 mg total) by mouth daily. Qty: 30 tablet, Refills: 2    atorvastatin (LIPITOR) 40 MG tablet Take 1 tablet (40 mg total) by mouth daily. Qty: 30 tablet, Refills: 0    benazepril (LOTENSIN) 40 MG tablet Take 1 tablet (40 mg total) by mouth daily. Qty: 30 tablet, Refills: 0    digoxin (LANOXIN) 0.125 MG tablet Take 1 tablet (0.125 mg total) by mouth daily. Qty: 30 tablet, Refills: 0    furosemide (LASIX) 40 MG tablet Take 1 tablet (40 mg total) by mouth 2 (two) times daily. Qty: 60 tablet, Refills: 0    isosorbide mononitrate (IMDUR) 60 MG 24 hr tablet Take 1 tablet (60 mg total) by mouth daily. Qty: 30 tablet, Refills: 0    omeprazole (PRILOSEC) 20 MG capsule Take 1 capsule (20 mg total) by mouth 2 (two) times daily before a meal. Qty: 60 capsule, Refills: 0    potassium chloride (K-DUR) 10 MEQ tablet Take 1 tablet (10 mEq total) by mouth 2  (two) times daily. Qty: 60 tablet, Refills: 0    rivaroxaban (XARELTO) 20 MG TABS tablet Take 1 tablet (20 mg total) by mouth daily with supper. Qty: 30 tablet, Refills: 0    sertraline (ZOLOFT) 50 MG tablet Take 1 tablet (50 mg total) by mouth daily. Qty: 30 tablet, Refills: 0      CONTINUE these medications which have NOT CHANGED   Details  magnesium oxide (MAG-OX) 400 MG tablet Take 400 mg by mouth 2 (two) times daily.      STOP taking these medications     amoxicillin-clavulanate (AUGMENTIN) 500-125 MG per tablet      doxycycline (VIBRA-TABS) 100 MG tablet        Follow-up Information   Follow up with PCP and cardiologist in [facility] Schedule an appointment as soon as possible for a visit in 2 weeks.      Follow up with [facility] On 05/04/2014. (at 9:45AM for your congestive heart failure.)    Specialty:  Cardiology   Contact information:   [address] [id number] [place] [address] [phone]      TOTAL DISCHARGE TIME: 35 mins  [facility]  [facility] Pager [phone]  04/30/2014, 8:44 AM  Disclaimer: This note was dictated with voice recognition software. Similar sounding words can inadvertently be transcribed and may not be corrected upon review.

## 2014-05-02 ENCOUNTER — Telehealth (HOSPITAL_COMMUNITY): Payer: Self-pay | Admitting: Vascular Surgery

## 2014-05-02 DIAGNOSIS — J209 Acute bronchitis, unspecified: Secondary | ICD-10-CM

## 2014-05-02 DIAGNOSIS — I4891 Unspecified atrial fibrillation: Secondary | ICD-10-CM

## 2014-05-02 DIAGNOSIS — I5022 Chronic systolic (congestive) heart failure: Secondary | ICD-10-CM

## 2014-05-02 DIAGNOSIS — I509 Heart failure, unspecified: Secondary | ICD-10-CM

## 2014-05-02 NOTE — Telephone Encounter (Signed)
Verbal order given for social work consult.  Patient in hospital and needs assistance with medications.

## 2014-05-02 NOTE — Telephone Encounter (Signed)
[facility] need a order for social work the patient does not have any of his medicines and cant afford them. Please advise.Marland KitchenMarland Kitchen

## 2014-05-04 ENCOUNTER — Inpatient Hospital Stay (HOSPITAL_COMMUNITY): Admit: 2014-05-04 | Payer: Medicare Other

## 2014-05-05 ENCOUNTER — Emergency Department (HOSPITAL_COMMUNITY): Payer: Medicare Other

## 2014-05-05 ENCOUNTER — Inpatient Hospital Stay (HOSPITAL_COMMUNITY)
Admission: EM | Admit: 2014-05-05 | Discharge: 2014-05-11 | DRG: 287 | Disposition: A | Discharge status: Home Health | Payer: Medicare Other | Attending: Internal Medicine | Admitting: Internal Medicine

## 2014-05-05 ENCOUNTER — Encounter (HOSPITAL_COMMUNITY): Payer: Self-pay | Admitting: Emergency Medicine

## 2014-05-05 DIAGNOSIS — I059 Rheumatic mitral valve disease, unspecified: Secondary | ICD-10-CM | POA: Diagnosis present

## 2014-05-05 DIAGNOSIS — I5023 Acute on chronic systolic (congestive) heart failure: Secondary | ICD-10-CM | POA: Diagnosis not present

## 2014-05-05 DIAGNOSIS — F1210 Cannabis abuse, uncomplicated: Secondary | ICD-10-CM | POA: Diagnosis present

## 2014-05-05 DIAGNOSIS — I509 Heart failure, unspecified: Secondary | ICD-10-CM | POA: Diagnosis present

## 2014-05-05 DIAGNOSIS — Z91148 Patient's other noncompliance with medication regimen for other reason: Secondary | ICD-10-CM

## 2014-05-05 DIAGNOSIS — Z720 Tobacco use: Secondary | ICD-10-CM | POA: Diagnosis present

## 2014-05-05 DIAGNOSIS — I2510 Atherosclerotic heart disease of native coronary artery without angina pectoris: Secondary | ICD-10-CM | POA: Diagnosis present

## 2014-05-05 DIAGNOSIS — R079 Chest pain, unspecified: Secondary | ICD-10-CM | POA: Diagnosis not present

## 2014-05-05 DIAGNOSIS — I4891 Unspecified atrial fibrillation: Secondary | ICD-10-CM

## 2014-05-05 DIAGNOSIS — I079 Rheumatic tricuspid valve disease, unspecified: Secondary | ICD-10-CM | POA: Diagnosis present

## 2014-05-05 DIAGNOSIS — E785 Hyperlipidemia, unspecified: Secondary | ICD-10-CM | POA: Diagnosis present

## 2014-05-05 DIAGNOSIS — I2589 Other forms of chronic ischemic heart disease: Secondary | ICD-10-CM | POA: Diagnosis present

## 2014-05-05 DIAGNOSIS — Z598 Other problems related to housing and economic circumstances: Secondary | ICD-10-CM

## 2014-05-05 DIAGNOSIS — Z95810 Presence of automatic (implantable) cardiac defibrillator: Secondary | ICD-10-CM | POA: Diagnosis present

## 2014-05-05 DIAGNOSIS — E876 Hypokalemia: Secondary | ICD-10-CM | POA: Diagnosis present

## 2014-05-05 DIAGNOSIS — Z888 Allergy status to other drugs, medicaments and biological substances status: Secondary | ICD-10-CM

## 2014-05-05 DIAGNOSIS — Z791 Long term (current) use of non-steroidal anti-inflammatories (NSAID): Secondary | ICD-10-CM

## 2014-05-05 DIAGNOSIS — Z9861 Coronary angioplasty status: Secondary | ICD-10-CM

## 2014-05-05 DIAGNOSIS — K7460 Unspecified cirrhosis of liver: Secondary | ICD-10-CM | POA: Diagnosis present

## 2014-05-05 DIAGNOSIS — Z9114 Patient's other noncompliance with medication regimen: Secondary | ICD-10-CM

## 2014-05-05 DIAGNOSIS — F17200 Nicotine dependence, unspecified, uncomplicated: Secondary | ICD-10-CM | POA: Diagnosis present

## 2014-05-05 DIAGNOSIS — Z91199 Patient's noncompliance with other medical treatment and regimen due to unspecified reason: Secondary | ICD-10-CM

## 2014-05-05 DIAGNOSIS — F1010 Alcohol abuse, uncomplicated: Secondary | ICD-10-CM | POA: Diagnosis present

## 2014-05-05 DIAGNOSIS — I482 Chronic atrial fibrillation, unspecified: Secondary | ICD-10-CM | POA: Diagnosis present

## 2014-05-05 DIAGNOSIS — Z8249 Family history of ischemic heart disease and other diseases of the circulatory system: Secondary | ICD-10-CM

## 2014-05-05 DIAGNOSIS — I4819 Other persistent atrial fibrillation: Secondary | ICD-10-CM

## 2014-05-05 DIAGNOSIS — I428 Other cardiomyopathies: Secondary | ICD-10-CM | POA: Diagnosis present

## 2014-05-05 DIAGNOSIS — I10 Essential (primary) hypertension: Secondary | ICD-10-CM | POA: Diagnosis present

## 2014-05-05 DIAGNOSIS — Z9119 Patient's noncompliance with other medical treatment and regimen: Secondary | ICD-10-CM

## 2014-05-05 DIAGNOSIS — I5022 Chronic systolic (congestive) heart failure: Secondary | ICD-10-CM | POA: Diagnosis present

## 2014-05-05 DIAGNOSIS — Z5987 Material hardship due to limited financial resources, not elsewhere classified: Secondary | ICD-10-CM

## 2014-05-05 DIAGNOSIS — D649 Anemia, unspecified: Secondary | ICD-10-CM | POA: Diagnosis present

## 2014-05-05 HISTORY — DX: Unspecified cirrhosis of liver: K74.60

## 2014-05-05 HISTORY — DX: Patient's noncompliance with other medical treatment and regimen: Z91.19

## 2014-05-05 HISTORY — DX: Chronic atrial fibrillation, unspecified: I48.20

## 2014-05-05 HISTORY — DX: Atherosclerotic heart disease of native coronary artery without angina pectoris: I25.10

## 2014-05-05 HISTORY — DX: Cardiomyopathy, unspecified: I42.9

## 2014-05-05 HISTORY — DX: Presence of automatic (implantable) cardiac defibrillator: Z95.810

## 2014-05-05 HISTORY — DX: Essential (primary) hypertension: I10

## 2014-05-05 HISTORY — DX: Patient's noncompliance with other medical treatment and regimen due to unspecified reason: Z91.199

## 2014-05-05 LAB — CBC WITH DIFFERENTIAL/PLATELET
Basophils Absolute: 0 10*3/uL (ref 0.0–0.1)
Basophils Relative: 0 % (ref 0–1)
Eosinophils Absolute: 0 10*3/uL (ref 0.0–0.7)
Eosinophils Relative: 0 % (ref 0–5)
HCT: 33.1 % — ABNORMAL LOW (ref 39.0–52.0)
Hemoglobin: 10.5 g/dL — ABNORMAL LOW (ref 13.0–17.0)
LYMPHS ABS: 0.6 10*3/uL — AB (ref 0.7–4.0)
LYMPHS PCT: 11 % — AB (ref 12–46)
MCH: 22.7 pg — ABNORMAL LOW (ref 26.0–34.0)
MCHC: 31.7 g/dL (ref 30.0–36.0)
MCV: 71.5 fL — AB (ref 78.0–100.0)
Monocytes Absolute: 0.2 10*3/uL (ref 0.1–1.0)
Monocytes Relative: 4 % (ref 3–12)
Neutro Abs: 4.6 10*3/uL (ref 1.7–7.7)
Neutrophils Relative %: 85 % — ABNORMAL HIGH (ref 43–77)
PLATELETS: 269 10*3/uL (ref 150–400)
RBC: 4.63 MIL/uL (ref 4.22–5.81)
RDW: 19.3 % — ABNORMAL HIGH (ref 11.5–15.5)
WBC: 5.4 10*3/uL (ref 4.0–10.5)

## 2014-05-05 LAB — COMPREHENSIVE METABOLIC PANEL
ALK PHOS: 91 U/L (ref 39–117)
ALT: 15 U/L (ref 0–53)
AST: 23 U/L (ref 0–37)
Albumin: 3.6 g/dL (ref 3.5–5.2)
Anion gap: 14 (ref 5–15)
BUN: 17 mg/dL (ref 6–23)
CO2: 24 meq/L (ref 19–32)
Calcium: 9.3 mg/dL (ref 8.4–10.5)
Chloride: 102 mEq/L (ref 96–112)
Creatinine, Ser: 0.96 mg/dL (ref 0.50–1.35)
Glucose, Bld: 106 mg/dL — ABNORMAL HIGH (ref 70–99)
Potassium: 3.8 mEq/L (ref 3.7–5.3)
SODIUM: 140 meq/L (ref 137–147)
Total Bilirubin: 3.9 mg/dL — ABNORMAL HIGH (ref 0.3–1.2)
Total Protein: 7.2 g/dL (ref 6.0–8.3)

## 2014-05-05 LAB — TROPONIN I: Troponin I: <0.3 ng/mL (ref <0.30)

## 2014-05-05 LAB — MRSA PCR SCREENING: MRSA by PCR: NEGATIVE

## 2014-05-05 LAB — I-STAT TROPONIN, ED: TROPONIN I, POC: 0.02 ng/mL (ref 0.00–0.08)

## 2014-05-05 LAB — PRO B NATRIURETIC PEPTIDE: Pro B Natriuretic peptide (BNP): 5876 pg/mL — ABNORMAL HIGH (ref 0–125)

## 2014-05-05 MED ORDER — ATORVASTATIN CALCIUM 40 MG PO TABS
40.0000 mg | ORAL_TABLET | Freq: Every day | ORAL | Status: DC
  Administered 2014-05-05 – 2014-05-11 (×7): 40 mg via ORAL
  Filled 2014-05-05 (×7): qty 1

## 2014-05-05 MED ORDER — DIGOXIN 125 MCG PO TABS
0.1250 mg | ORAL_TABLET | Freq: Every day | ORAL | Status: DC
  Administered 2014-05-06 – 2014-05-11 (×6): 0.125 mg via ORAL
  Filled 2014-05-05 (×6): qty 1

## 2014-05-05 MED ORDER — ISOSORBIDE MONONITRATE ER 30 MG PO TB24
30.0000 mg | ORAL_TABLET | Freq: Every day | ORAL | Status: DC
  Administered 2014-05-06 – 2014-05-09 (×4): 30 mg via ORAL
  Filled 2014-05-05 (×5): qty 1

## 2014-05-05 MED ORDER — CARVEDILOL 3.125 MG PO TABS
3.1250 mg | ORAL_TABLET | Freq: Two times a day (BID) | ORAL | Status: DC
  Administered 2014-05-06 – 2014-05-11 (×11): 3.125 mg via ORAL
  Filled 2014-05-05 (×13): qty 1

## 2014-05-05 MED ORDER — FUROSEMIDE 10 MG/ML IJ SOLN
40.0000 mg | Freq: Once | INTRAMUSCULAR | Status: AC
  Administered 2014-05-05: 40 mg via INTRAVENOUS
  Filled 2014-05-05: qty 4

## 2014-05-05 MED ORDER — PANTOPRAZOLE SODIUM 40 MG PO TBEC
40.0000 mg | DELAYED_RELEASE_TABLET | Freq: Every day | ORAL | Status: DC
  Administered 2014-05-06 – 2014-05-11 (×6): 40 mg via ORAL
  Filled 2014-05-05 (×6): qty 1

## 2014-05-05 MED ORDER — ALBUTEROL SULFATE (2.5 MG/3ML) 0.083% IN NEBU: 2.5000 mg | INHALATION_SOLUTION | Freq: Four times a day (QID) | RESPIRATORY_TRACT | Status: DC | PRN

## 2014-05-05 MED ORDER — POTASSIUM CHLORIDE ER 10 MEQ PO TBCR
10.0000 meq | EXTENDED_RELEASE_TABLET | Freq: Two times a day (BID) | ORAL | Status: DC
  Administered 2014-05-05 – 2014-05-06 (×2): 10 meq via ORAL
  Filled 2014-05-05 (×4): qty 1

## 2014-05-05 MED ORDER — MORPHINE SULFATE 4 MG/ML IJ SOLN
4.0000 mg | Freq: Once | INTRAMUSCULAR | Status: AC
  Administered 2014-05-05: 4 mg via INTRAVENOUS
  Filled 2014-05-05: qty 1

## 2014-05-05 MED ORDER — ONDANSETRON HCL 4 MG/2ML IJ SOLN
4.0000 mg | Freq: Four times a day (QID) | INTRAMUSCULAR | Status: DC | PRN
  Administered 2014-05-07: 4 mg via INTRAVENOUS
  Filled 2014-05-05: qty 2

## 2014-05-05 MED ORDER — ACETAMINOPHEN 650 MG RE SUPP: 650.0000 mg | Freq: Four times a day (QID) | RECTAL | Status: DC | PRN

## 2014-05-05 MED ORDER — ONDANSETRON HCL 4 MG/2ML IJ SOLN
4.0000 mg | Freq: Once | INTRAMUSCULAR | Status: AC
  Administered 2014-05-05: 4 mg via INTRAVENOUS
  Filled 2014-05-05: qty 2

## 2014-05-05 MED ORDER — ACETAMINOPHEN 325 MG PO TABS: 650.0000 mg | ORAL_TABLET | Freq: Four times a day (QID) | ORAL | Status: DC | PRN

## 2014-05-05 MED ORDER — RIVAROXABAN 20 MG PO TABS
20.0000 mg | ORAL_TABLET | Freq: Every day | ORAL | Status: DC
  Administered 2014-05-06 – 2014-05-07 (×2): 20 mg via ORAL
  Filled 2014-05-05 (×3): qty 1

## 2014-05-05 MED ORDER — FUROSEMIDE 40 MG PO TABS
40.0000 mg | ORAL_TABLET | Freq: Two times a day (BID) | ORAL | Status: DC
  Administered 2014-05-06: 40 mg via ORAL
  Filled 2014-05-05 (×3): qty 1

## 2014-05-05 MED ORDER — LISINOPRIL 2.5 MG PO TABS
2.5000 mg | ORAL_TABLET | Freq: Every day | ORAL | Status: DC
  Administered 2014-05-06 – 2014-05-11 (×5): 2.5 mg via ORAL
  Filled 2014-05-05 (×6): qty 1

## 2014-05-05 MED ORDER — MORPHINE SULFATE 2 MG/ML IJ SOLN
1.0000 mg | INTRAMUSCULAR | Status: DC | PRN
  Administered 2014-05-08 – 2014-05-11 (×4): 1 mg via INTRAVENOUS
  Filled 2014-05-05 (×4): qty 1

## 2014-05-05 MED ORDER — ONDANSETRON HCL 4 MG PO TABS: 4.0000 mg | ORAL_TABLET | Freq: Four times a day (QID) | ORAL | Status: DC | PRN

## 2014-05-05 MED ORDER — SODIUM CHLORIDE 0.9 % IJ SOLN
3.0000 mL | Freq: Two times a day (BID) | INTRAMUSCULAR | Status: DC
  Administered 2014-05-05 – 2014-05-11 (×11): 3 mL via INTRAVENOUS

## 2014-05-05 MED ORDER — ASPIRIN EC 81 MG PO TBEC
81.0000 mg | DELAYED_RELEASE_TABLET | Freq: Every day | ORAL | Status: DC
  Administered 2014-05-06 – 2014-05-07 (×2): 81 mg via ORAL
  Filled 2014-05-05 (×3): qty 1

## 2014-05-05 MED ORDER — SERTRALINE HCL 50 MG PO TABS
50.0000 mg | ORAL_TABLET | Freq: Every day | ORAL | Status: DC
  Administered 2014-05-06 – 2014-05-11 (×6): 50 mg via ORAL
  Filled 2014-05-05 (×7): qty 1

## 2014-05-05 MED ORDER — DIGOXIN 125 MCG PO TABS: 0.1250 mg | ORAL_TABLET | Freq: Every day | ORAL | Status: DC

## 2014-05-05 NOTE — ED Provider Notes (Signed)
Patient handed off from [facility], PA-C at the end of his shift.    48 y.o. male with a Past Medical History of chronic systolic heart failure (last EF per care everywhere around 15% in June 2012),s/p Biotronik ICD/pacemaker in 2011, HLD, ischemic cardiomyopathy, CAD (coronary artery disease) 08/14/04 Angioplasty x 1 vessel 11/05, cardiac catheterization Feb 2012 20% LCx & RCA, chronic atrial fibrillation, alcohol and marijuana abuse, liver cirrhosis who presents to the ED for the 4th time in the past 20 days with chest pains.   He reports waking up this morning with pain that shoots back and forth across his chest. He reports not normally having pains but since he has not had his Milrinone infusion for the past 3 weeks (he receives these in [place], [place])  he has been experiencing pains. As an inpatient he has been r/o as ACS and they believe his pains have been related to acute on chronic systolic CHF. He reports only being in town for 2 more weeks and that he will be going home soon. This pain does not feel like his previous MI. The pain is similar to his previous CHF exacerbations. Aside from chest pain he has not had any lower extremity swelling, orthopnea, SOB, confusion or weakness. When the pain did not relieve into the evening he decided to come to hte ER for further evaluation.   Triage note dictates pain worse with inspiration and palpation. They also reports SOB. The patient is denying these complaints to me.  6: 41 pm Patient given two rounds of pain medications and is still experiencing pain. Atypical for ACS but may be due to CHF acute on chronic. Due to significant cardiac history I believe this patient unfortunately need to be admitted. Will consult cardiology.  7:11am Cardiology reports that the patient will need to be admitted into the hospital. He is not taking his medications at home and is not compliant most likely due to financial reasons.  MC, inpatient, team 10,  stepdown  Dorthula Matas, PA-C 05/05/14 1932

## 2014-05-05 NOTE — H&P (Signed)
[facility] History and Physical  Tim RamsayDavid Burdi WUJ:[id number] DOB: 04/22/1966 DOA: 05/05/2014  Referring physician: ER physician. PCP: No primary provider on file.   Chief Complaint: Chest pain and shortness of breath.  HPI: Tim Reyes is a 48 y.o. male with history of CAD status post stenting, chronic systolic heart failure status post ICD placement, atrial fibrillation on xarelto, hypertension, liver cirrhosis, anemia presented to the ER with complaints of chest pain. Patient has been having chest pain since morning which was retrosternal pressure-like burning sensation nonradiating and has been having shortness of breath along with it. Patient's symptoms were present at rest. Had one episode of nausea vomiting denies any abdominal pain diarrhea fever chills or any productive cough. In the ER cardiac markers were negative on-call cardiologist was consulted and at this time they have requested admission for further observation. Patient has not been taking his medications since his recent discharge due to financial issues. Patient has been recently admitted twice. Patient is in [place] visiting his daughter.   Review of Systems: As presented in the history of presenting illness, rest negative.  Past Medical History  Diagnosis Date  . Essential hypertension, benign   . MI (myocardial infarction)   . ICD (implantable cardioverter-defibrillator) in place   . Chronic atrial fibrillation   . Cardiomyopathy     Possibly nonischemic based on that information, LVEF 15%  . Sleep apnea   . Coronary atherosclerosis of native coronary artery     PCTA 2005 - single vessel, reportedly patent 2012  . Noncompliance   . Cirrhosis    Past Surgical History  Procedure Laterality Date  . Tonsillectomy    . Ep implantable device     Social History:  reports that he has been smoking Cigarettes.  He has a 7.5 pack-year smoking history. He does not have any smokeless tobacco history on file. He  reports that he drinks alcohol. He reports that he uses illicit drugs (Marijuana) about 7 times per week. Where does patient live lives with his daughter. Can patient participate in ADLs? Yes.  Allergies  Allergen Reactions  . Vicodin [Hydrocodone-Acetaminophen] Nausea And Vomiting    Family History:  Family History  Problem Relation Age of Onset  . Heart disease Father   . Heart disease Sister   . Heart disease Mother       Prior to Admission medications   Medication Sig Start Date End Date Taking? Authorizing Provider  albuterol (PROVENTIL) (2.5 MG/3ML) 0.083% nebulizer solution Take 3 mLs (2.5 mg total) by nebulization every 6 (six) hours as needed for wheezing or shortness of breath. 04/30/14  Yes Osvaldo ShipperGokul Krishnan, MD  amLODipine (NORVASC) 10 MG tablet Take 1 tablet (10 mg total) by mouth daily. 04/30/14  Yes Osvaldo ShipperGokul Krishnan, MD  atorvastatin (LIPITOR) 40 MG tablet Take 1 tablet (40 mg total) by mouth daily. 04/30/14  Yes Osvaldo ShipperGokul Krishnan, MD  benazepril (LOTENSIN) 40 MG tablet Take 1 tablet (40 mg total) by mouth daily. 04/30/14  Yes Osvaldo ShipperGokul Krishnan, MD  digoxin (LANOXIN) 0.125 MG tablet Take 1 tablet (0.125 mg total) by mouth daily. 04/30/14  Yes Osvaldo ShipperGokul Krishnan, MD  furosemide (LASIX) 40 MG tablet Take 1 tablet (40 mg total) by mouth 2 (two) times daily. 04/30/14  Yes Osvaldo ShipperGokul Krishnan, MD  isosorbide mononitrate (IMDUR) 60 MG 24 hr tablet Take 1 tablet (60 mg total) by mouth daily. 04/30/14  Yes Osvaldo ShipperGokul Krishnan, MD  levofloxacin (LEVAQUIN) 750 MG tablet Take 1 tablet (750 mg total) by mouth daily. 04/30/14  Yes Osvaldo Shipper, MD  magnesium oxide (MAG-OX) 400 MG tablet Take 400 mg by mouth 2 (two) times daily.   Yes Historical Provider, MD  omeprazole (PRILOSEC) 20 MG capsule Take 1 capsule (20 mg total) by mouth 2 (two) times daily before a meal. 04/30/14  Yes Osvaldo Shipper, MD  potassium chloride (K-DUR) 10 MEQ tablet Take 1 tablet (10 mEq total) by mouth 2 (two) times daily. 04/30/14  Yes Osvaldo Shipper, MD  predniSONE (DELTASONE) 20 MG tablet Take 20-60 mg by mouth daily. Take 60 mg daily for 3 days, 40 mg daily for 4 days, 20 mg daily for 4 days, then stop. Starting 05/04/14   Yes Historical Provider, MD  rivaroxaban (XARELTO) 20 MG TABS tablet Take 1 tablet (20 mg total) by mouth daily with supper. 04/30/14  Yes Osvaldo Shipper, MD  sertraline (ZOLOFT) 50 MG tablet Take 1 tablet (50 mg total) by mouth daily. 04/30/14  Yes Osvaldo Shipper, MD    Physical Exam: Filed Vitals:   05/05/14 1930 05/05/14 1932 05/05/14 1945 05/05/14 2010  BP: 115/72  128/74   Pulse: 79  77   Temp:    98.1 F (36.7 C)  TempSrc:    Oral  Resp: 24  20   Height:      Weight:      SpO2: 99% 99% 100%      General:  Moderately built and nourished.  Eyes: Anicteric no pallor.  ENT: No discharge from ears eyes nose mouth.  Neck: No mass felt. JVD elevated.  Cardiovascular: S1-S2 heard.  Respiratory: No rhonchi or crepitations.  Abdomen: Nontender bowel sounds present no guarding or rigidity.  Skin: No rash.  Musculoskeletal: No edema.  Psychiatric: Appears normal.  Neurologic: Alert and oriented to time place and person. Moves all extremities.  Labs on Admission:  Basic Metabolic Panel:  Recent Labs Lab 04/28/14 2250 04/29/14 0429 04/30/14 0100 05/05/14 1610  NA 140 143 139 140  K 3.6* 3.5* 4.4 3.8  CL 101 101 98 102  CO2 23 26 26 24   GLUCOSE 166* 165* 151* 106*  BUN 17 17 24* 17  CREATININE 0.92 0.96 1.08 0.96  CALCIUM 9.1 9.1 9.3 9.3   Liver Function Tests:  Recent Labs Lab 04/29/14 0429 05/05/14 1610  AST 24 23  ALT 11 15  ALKPHOS 82 91  BILITOT 4.0* 3.9*  PROT 7.2 7.2  ALBUMIN 3.6 3.6    Recent Labs Lab 04/29/14 0429  LIPASE 15   No results found for this basename: AMMONIA,  in the last 168 hours CBC:  Recent Labs Lab 04/30/14 0100 05/05/14 1610  WBC 10.9* 5.4  NEUTROABS  --  4.6  HGB 9.1* 10.5*  HCT 29.3* 33.1*  MCV 72.9* 71.5*  PLT 228 269    Cardiac Enzymes:  Recent Labs Lab 04/28/14 2250 04/30/14  TROPONINI <0.30 <0.30    BNP (last 3 results)  Recent Labs  04/20/14 2159 04/28/14 0811 05/05/14 1610  PROBNP 2601.0* 4523.0* 5876.0*   CBG: No results found for this basename: GLUCAP,  in the last 168 hours  Radiological Exams on Admission: Dg Chest 2 View  05/05/2014   CLINICAL DATA:  Chest pain, recent shortness of breath, smoking history  EXAM: CHEST  2 VIEW  COMPARISON:  Chest x-ray of 04/28/2014  FINDINGS: Moderate cardiomegaly is stable. There may be minimal pulmonary vascular congestion present but no frank congestive heart failure or effusion is seen. AICD leads remain. A right PICC line is present with  the tip seen to the lower SVC.  IMPRESSION: 1. Stable cardiomegaly with perhaps minimal pulmonary vascular congestion. No definite CHF or effusion. 2. AICD leads and right PICC line remain.   Electronically Signed   By: Dwyane Dee M.D.   On: 05/05/2014 16:43    EKG: Independently reviewed. Paced rhythm.  Assessment/Plan Principal Problem:   Chest pain with moderate risk of acute coronary syndrome Active Problems:   Chronic systolic congestive heart failure, NYHA class 4   Chronic atrial fibrillation   HTN (hypertension)   CAD- PCI 2005, cath 2012 [place]   Non compliance w medication regimen   Liver cirrhosis   Tobacco abuse   AICD (automatic cardioverter/defibrillator) present   Chest pain   1. Chest pain history of CAD - still has chest pain. Cycle cardiac markers. Keep n.p.o. past morning. Cardiology following. When necessary morphine and nitroglycerin. Aspirin. Patient is on xarelto. 2. Chronic systolic heart failure last EF measured this month was 15% status post ICD - cardiologist had ordered one dose of IV Lasix. Continue hold Lasix and closely follow metabolic panel intake output and daily weights. 3. Atrial fibrillation - rate controlled on xarelto. 4. Liver cirrhosis - on Lasix. Patient states he  may have cirrhosis from drinking alcohol but states he rarely drinks now. 5. Tobacco abuse - advised to quit smoking. 6. Chronic anemia - follow CBC and further workup as outpatient.  Patient advised to be compliant with his medications.    Code Status: Full code.  Family Communication: None.  Disposition Plan: Admit for observation.    Marykay Mccleod N. [facility] Pager [phone].  If 7PM-7AM, please contact night-coverage [email or web address] Password [facility] 05/05/2014, 8:17 PM

## 2014-05-05 NOTE — Consult Note (Signed)
Primary Cardiologist: Dr. Lahoma RockerLindsey Rafanan, [facility], [place] Silvan([place])  Reason for Consult:   Chest pain  Requesting Physician: ER  HPI: This [place] 48 [place] medical history significant for chronic systolic heart failure - EF 15% by echo 04/21/14.- (same as previous echo in 2012.). He is s/p Biotronik ICD/pacemaker in 2011. He has CAD s/p angioplasty x 1 vessel Nov 2005.  Cardiac catheterization Feb 2012- 20% LCx & RCA. He has chronic atrial fibrillation and is on Xarelto. Other problems include-, alcohol and marijuana abuse, HLD,  liver cirrhosis, and medical non compliance secondary to cost.   This is the 4th time he has been to the ER in 4 weeks. He was just discharged 04/21/14 after an admission for chest pain and CHF.  [place] [place]as soon as I get some money". He admits he did not have his medications again at home after his recent discharge. He came to the ER tonight with complaints of chest pain starting this am. He is not particularly SOB. NTG did not hel, MSO4 did. BNP was 5800, CXR did not show CHF.    PMHx:  Past Medical History  Diagnosis Date  . Essential hypertension, benign   . MI (myocardial infarction)   . ICD (implantable cardioverter-defibrillator) in place   . Chronic atrial fibrillation   . Cardiomyopathy     Possibly nonischemic based on that information, LVEF 15%  . Sleep apnea   . Coronary atherosclerosis of native coronary artery     PCTA 2005 - single vessel, reportedly patent 2012  . Noncompliance   . Cirrhosis     Past Surgical History  Procedure Laterality Date  . Tonsillectomy    . Ep implantable device      SOCHx:  reports that he has been smoking Cigarettes.  He has a 7.5 pack-year smoking history. He does not have any smokeless tobacco history on file. He reports that he drinks alcohol. He reports that he uses illicit drugs (Marijuana) about 7 times  per week.  FAMHx: Family History  Problem Relation Age of Onset  . Heart disease Father   . Heart disease Sister   . Heart disease Mother     ALLERGIES: Allergies  Allergen Reactions  . Vicodin [Hydrocodone-Acetaminophen] Nausea And Vomiting    ROS: Pertinent items are noted in HPI. see H&P for complete details  HOME MEDICATIONS: Prior to Admission medications   Medication Sig Start Date End Date Taking? Authorizing Provider  albuterol (PROVENTIL) (2.5 MG/3ML) 0.083% nebulizer solution Take 3 mLs (2.5 mg total) by nebulization every 6 (six) hours as needed for wheezing or shortness of breath. 04/30/14  Yes Osvaldo ShipperGokul Krishnan, MD  amLODipine (NORVASC) 10 MG tablet Take 1 tablet (10 mg total) by mouth daily. 04/30/14  Yes Osvaldo ShipperGokul Krishnan, MD  atorvastatin (LIPITOR) 40 MG tablet Take 1 tablet (40 mg total) by mouth daily. 04/30/14  Yes Osvaldo ShipperGokul Krishnan, MD  benazepril (LOTENSIN) 40 MG tablet Take 1 tablet (40 mg total) by mouth daily. 04/30/14  Yes Osvaldo ShipperGokul Krishnan, MD  digoxin (LANOXIN) 0.125 MG tablet Take 1 tablet (0.125 mg total) by mouth daily. 04/30/14  Yes Osvaldo ShipperGokul Krishnan, MD  furosemide (LASIX) 40 MG tablet Take 1 tablet (40 mg total) by mouth 2 (two) times daily. 04/30/14  Yes Osvaldo ShipperGokul Krishnan, MD  isosorbide mononitrate (IMDUR) 60 MG 24 hr tablet Take 1 tablet (60 mg total) by  mouth daily. 04/30/14  Yes Osvaldo Shipper, MD  levofloxacin (LEVAQUIN) 750 MG tablet Take 1 tablet (750 mg total) by mouth daily. 04/30/14  Yes Osvaldo Shipper, MD  magnesium oxide (MAG-OX) 400 MG tablet Take 400 mg by mouth 2 (two) times daily.   Yes Historical Provider, MD  omeprazole (PRILOSEC) 20 MG capsule Take 1 capsule (20 mg total) by mouth 2 (two) times daily before a meal. 04/30/14  Yes Osvaldo Shipper, MD  potassium chloride (K-DUR) 10 MEQ tablet Take 1 tablet (10 mEq total) by mouth 2 (two) times daily. 04/30/14  Yes Osvaldo Shipper, MD  predniSONE (DELTASONE) 20 MG tablet Take 20-60 mg by mouth daily. Take 60 mg  daily for 3 days, 40 mg daily for 4 days, 20 mg daily for 4 days, then stop. Starting 05/04/14   Yes Historical Provider, MD  rivaroxaban (XARELTO) 20 MG TABS tablet Take 1 tablet (20 mg total) by mouth daily with supper. 04/30/14  Yes Osvaldo Shipper, MD  sertraline (ZOLOFT) 50 MG tablet Take 1 tablet (50 mg total) by mouth daily. 04/30/14  Yes Osvaldo Shipper, MD    HOSPITAL MEDICATIONS: I have reviewed the patient's current medications.  VITALS: Blood pressure 116/72, pulse 90, temperature 98.2 F (36.8 C), temperature source Oral, resp. rate 17, height 5\' 11"  (1.803 m), weight 207 lb (93.895 kg), SpO2 100.00%.  PHYSICAL EXAM: General appearance: alert, cooperative and no distress Neck: no carotid bruit and no JVD Lungs: clear to auscultation bilaterally Heart: irregularly irregular rhythm Abdomen: soft, non-tender; bowel sounds normal; no masses,  no organomegaly Extremities: trace edema Pulses: 2+ and symmetric Skin: Skin color, texture, turgor normal. No rashes or lesions Neurologic: Grossly normal  LABS: Results for orders placed during the hospital encounter of 05/05/14 (from the past 24 hour(s))  CBC WITH DIFFERENTIAL     Status: Abnormal   Collection Time    05/05/14  4:10 PM      Result Value Ref Range   WBC 5.4  4.0 - 10.5 K/uL   RBC 4.63  4.22 - 5.81 MIL/uL   Hemoglobin 10.5 (*) 13.0 - 17.0 g/dL   HCT 16.1 (*) 09.6 - 04.5 %   MCV 71.5 (*) 78.0 - 100.0 fL   MCH 22.7 (*) 26.0 - 34.0 pg   MCHC 31.7  30.0 - 36.0 g/dL   RDW 40.9 (*) 81.1 - 91.4 %   Platelets 269  150 - 400 K/uL   Neutrophils Relative % 85 (*) 43 - 77 %   Neutro Abs 4.6  1.7 - 7.7 K/uL   Lymphocytes Relative 11 (*) 12 - 46 %   Lymphs Abs 0.6 (*) 0.7 - 4.0 K/uL   Monocytes Relative 4  3 - 12 %   Monocytes Absolute 0.2  0.1 - 1.0 K/uL   Eosinophils Relative 0  0 - 5 %   Eosinophils Absolute 0.0  0.0 - 0.7 K/uL   Basophils Relative 0  0 - 1 %   Basophils Absolute 0.0  0.0 - 0.1 K/uL  COMPREHENSIVE  METABOLIC PANEL     Status: Abnormal   Collection Time    05/05/14  4:10 PM      Result Value Ref Range   Sodium 140  137 - 147 mEq/L   Potassium 3.8  3.7 - 5.3 mEq/L   Chloride 102  96 - 112 mEq/L   CO2 24  19 - 32 mEq/L   Glucose, Bld 106 (*) 70 - 99 mg/dL   BUN 17  6 - 23 mg/dL   Creatinine, Ser 7.07  0.50 - 1.35 mg/dL   Calcium 9.3  8.4 - 61.5 mg/dL   Total Protein 7.2  6.0 - 8.3 g/dL   Albumin 3.6  3.5 - 5.2 g/dL   AST 23  0 - 37 U/L   ALT 15  0 - 53 U/L   Alkaline Phosphatase 91  39 - 117 U/L   Total Bilirubin 3.9 (*) 0.3 - 1.2 mg/dL   GFR calc non Af Amer >90  >90 mL/min   GFR calc Af Amer >90  >90 mL/min   Anion gap 14  5 - 15  PRO B NATRIURETIC PEPTIDE     Status: Abnormal   Collection Time    05/05/14  4:10 PM      Result Value Ref Range   Pro B Natriuretic peptide (BNP) 5876.0 (*) 0 - 125 pg/mL  I-STAT TROPOININ, ED     Status: None   Collection Time    05/05/14  4:21 PM      Result Value Ref Range   Troponin i, poc 0.02  0.00 - 0.08 ng/mL   Comment 3             EKG: AF POD  IMAGING: Dg Chest 2 View  05/05/2014   CLINICAL DATA:  Chest pain, recent shortness of breath, smoking history  EXAM: CHEST  2 VIEW  COMPARISON:  Chest x-ray of 04/28/2014  FINDINGS: Moderate cardiomegaly is stable. There may be minimal pulmonary vascular congestion present but no frank congestive heart failure or effusion is seen. AICD leads remain. A right PICC line is present with the tip seen to the lower SVC.  IMPRESSION: 1. Stable cardiomegaly with perhaps minimal pulmonary vascular congestion. No definite CHF or effusion. 2. AICD leads and right PICC line remain.   Electronically Signed   By: Dwyane Dee M.D.   On: 05/05/2014 16:43    IMPRESSION: Principal Problem:   Chest pain with moderate risk of acute coronary syndrome Active Problems:   Chronic systolic congestive heart failure, NYHA class 4   Chronic atrial fibrillation   HTN (hypertension)   CAD- PCI 2005, cath 2012 OK    Non compliance w medication regimen   Liver cirrhosis   Tobacco abuse   AICD (automatic cardioverter/defibrillator) present   RECOMMENDATION: Admit, resume medications as written, cycle Troponin- no further chest pain work up unless these turn positive.   Time Spent Directly with Patient: 45 minutes  Corine Shelter, [place] [phone] beeper 05/05/2014, 7:25 PM    Attending note:  Patient seen and examined. Reviewed available records including recent discharge summary. Patient was just discharged from the hospital after a brief stay from 8/13 to 8/15 with chest pain and heart failure symptoms. He is visiting in town from [place], will probably not be returning for the next few weeks, has been "out of money" and also not taking his medications regularly. 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 He has had four recent hospital encounters related to similar presentations, and returns again today stating that he awoke this morning with chest pain and difficulty breathing. He admits that he has not been taking his medication since recent discharge, states that a Child psychotherapist delivered them today and he took one dose. I see that he was in fact scheduled to be seen in the Advanced Heart Failure clinic yesterday, however  did not show.   He is hemodynamically stable at present, continues to complain of intermittent chest pain. Initial troponin I is negative. Pro-BNP 5876, chest x-ray showing cardiomegaly with minimal vascular congestion. Renal function is normal, he is mildly anemic with hemoglobin 10.5, recent TSH 1.2. ECG shows a ventricular paced rhythm.  Patient recently discharged from hospitalist service. Would recommend readmission, initiate basic cardiac regimen (low-dose ACE inhibitor, digoxin, low-dose Coreg, Imdur, Xarelto, IV diuretic - hold Norvasc  for now. Cycle a full set of cardiac markers, although doubt ACS at this time. Would ask for Advanced Heart Failure consultation as an inpatient. Patient is at high risk for recurrent readmissions.  Jonelle Sidle, M.D., F.A.C.C.

## 2014-05-05 NOTE — ED Provider Notes (Signed)
CSN: [id number]     Arrival date & time 05/05/14  1520 History   First MD Initiated Contact with Patient 05/05/14 1526     Chief Complaint  Patient presents with  . Chest Pain     (Consider location/radiation/quality/duration/timing/severity/associated sxs/prior Treatment) HPI Tim Reyes is a 48 y.o. male with a history of end-stage CHF, who complains of chest pain and shortness of breath. He has multiple recent admissions for the same complaints. His most recent discharge was August 15. He reports the chest pain shortness of breath started this morning at 6:00, was nonexertional, and his discomfort was a 10 out of 10. He describes it as a burning and tightness across his whole chest. He says it feels the same as it did when he went to the hospital last week. He reports that he has a PICC line for which he is supposed to receive medicines 3 times a week, but has not been compliant because he says he does not know where he is supposed to go. He says he's been down here for about a month and only knows where the hospital is and where his son's house is. He denies any nausea, vomiting, headache, abdominal pain, numbness weakness. He reports no new symptoms since his most recent admission.  Past Medical History  Diagnosis Date  . Essential hypertension, benign   . MI (myocardial infarction)   . ICD (implantable cardioverter-defibrillator) in place   . Chronic atrial fibrillation   . Cardiomyopathy     Possibly nonischemic based on that information, LVEF 15%  . Sleep apnea   . Coronary atherosclerosis of native coronary artery     PCTA 2005 - single vessel, reportedly patent 2012  . Noncompliance   . Cirrhosis    Past Surgical History  Procedure Laterality Date  . Tonsillectomy    . Ep implantable device     Family History  Problem Relation Age of Onset  . Heart disease Father   . Heart disease Sister   . Heart disease Mother    History  Substance Use Topics  . Smoking status:  Current Every Day Smoker -- 0.50 packs/day for 15 years    Types: Cigarettes  . Smokeless tobacco: Not on file  . Alcohol Use: Yes     Comment: occasional    Review of Systems  Constitutional: Negative for fever.  HENT: Negative for sore throat.   Eyes: Negative for visual disturbance.  Respiratory: Positive for chest tightness and shortness of breath.   Cardiovascular: Negative for chest pain.  Gastrointestinal: Negative for abdominal pain.  Endocrine: Negative for polyuria.  Genitourinary: Negative for dysuria.  Skin: Negative for rash.  Neurological: Negative for headaches.      Allergies  Vicodin  Home Medications   Prior to Admission medications   Medication Sig Start Date End Date Taking? Authorizing Provider  albuterol (PROVENTIL) (2.5 MG/3ML) 0.083% nebulizer solution Take 3 mLs (2.5 mg total) by nebulization every 6 (six) hours as needed for wheezing or shortness of breath. 04/30/14  Yes Osvaldo ShipperGokul Krishnan, MD  amLODipine (NORVASC) 10 MG tablet Take 1 tablet (10 mg total) by mouth daily. 04/30/14  Yes Osvaldo ShipperGokul Krishnan, MD  atorvastatin (LIPITOR) 40 MG tablet Take 1 tablet (40 mg total) by mouth daily. 04/30/14  Yes Osvaldo ShipperGokul Krishnan, MD  benazepril (LOTENSIN) 40 MG tablet Take 1 tablet (40 mg total) by mouth daily. 04/30/14  Yes Osvaldo ShipperGokul Krishnan, MD  digoxin (LANOXIN) 0.125 MG tablet Take 1 tablet (0.125 mg total) by mouth  daily. 04/30/14  Yes Osvaldo Shipper, MD  furosemide (LASIX) 40 MG tablet Take 1 tablet (40 mg total) by mouth 2 (two) times daily. 04/30/14  Yes Osvaldo Shipper, MD  isosorbide mononitrate (IMDUR) 60 MG 24 hr tablet Take 1 tablet (60 mg total) by mouth daily. 04/30/14  Yes Osvaldo Shipper, MD  levofloxacin (LEVAQUIN) 750 MG tablet Take 1 tablet (750 mg total) by mouth daily. 04/30/14  Yes Osvaldo Shipper, MD  magnesium oxide (MAG-OX) 400 MG tablet Take 400 mg by mouth 2 (two) times daily.   Yes Historical Provider, MD  omeprazole (PRILOSEC) 20 MG capsule Take 1 capsule (20  mg total) by mouth 2 (two) times daily before a meal. 04/30/14  Yes Osvaldo Shipper, MD  potassium chloride (K-DUR) 10 MEQ tablet Take 1 tablet (10 mEq total) by mouth 2 (two) times daily. 04/30/14  Yes Osvaldo Shipper, MD  predniSONE (DELTASONE) 20 MG tablet Take 20-60 mg by mouth daily. Take 60 mg daily for 3 days, 40 mg daily for 4 days, 20 mg daily for 4 days, then stop. Starting 05/04/14   Yes Historical Provider, MD  rivaroxaban (XARELTO) 20 MG TABS tablet Take 1 tablet (20 mg total) by mouth daily with supper. 04/30/14  Yes Osvaldo Shipper, MD  sertraline (ZOLOFT) 50 MG tablet Take 1 tablet (50 mg total) by mouth daily. 04/30/14  Yes Osvaldo Shipper, MD   BP 126/95  Pulse 65  Temp(Src) 97.5 F (36.4 C) (Oral)  Resp 27  Ht 5\' 11"  (1.803 m)  Wt 193 lb 9 oz (87.8 kg)  BMI 27.01 kg/m2  SpO2 100% Physical Exam  Nursing note and vitals reviewed. Constitutional: He appears well-developed and well-nourished. No distress.  HENT:  Head: Normocephalic and atraumatic.  Mouth/Throat: Oropharynx is clear and moist.  Eyes: Pupils are equal, round, and reactive to light. Right eye exhibits no discharge. Left eye exhibits no discharge. Scleral icterus is present.  Neck: Normal range of motion. Neck supple. No JVD present.  Cardiovascular: Normal rate.   Patient has pacemaker- vent paced  Pulmonary/Chest: Effort normal and breath sounds normal. No respiratory distress.  No pedal edema appreciated.  Abdominal: Soft. There is no tenderness.  Musculoskeletal: He exhibits no tenderness.  Skin: Skin is warm and dry. He is not diaphoretic.    ED Course  Procedures (including critical care time) Labs Review Labs Reviewed  CBC WITH DIFFERENTIAL - Abnormal; Notable for the following:    Hemoglobin 10.5 (*)    HCT 33.1 (*)    MCV 71.5 (*)    MCH 22.7 (*)    RDW 19.3 (*)    Neutrophils Relative % 85 (*)    Lymphocytes Relative 11 (*)    Lymphs Abs 0.6 (*)    All other components within normal limits   COMPREHENSIVE METABOLIC PANEL - Abnormal; Notable for the following:    Glucose, Bld 106 (*)    Total Bilirubin 3.9 (*)    All other components within normal limits  PRO B NATRIURETIC PEPTIDE - Abnormal; Notable for the following:    Pro B Natriuretic peptide (BNP) 5876.0 (*)    All other components within normal limits  BASIC METABOLIC PANEL - Abnormal; Notable for the following:    Potassium 3.3 (*)    Glucose, Bld 117 (*)    All other components within normal limits  CBC - Abnormal; Notable for the following:    RBC 4.16 (*)    Hemoglobin 9.3 (*)    HCT 30.0 (*)  MCV 72.1 (*)    MCH 22.4 (*)    RDW 19.4 (*)    All other components within normal limits  MRSA PCR SCREENING  TROPONIN I  TROPONIN I  TROPONIN I  TROPONIN I  CARBOXYHEMOGLOBIN  MAGNESIUM  I-STAT TROPOININ, ED    Imaging Review Dg Chest 2 View  05/05/2014   CLINICAL DATA:  Chest pain, recent shortness of breath, smoking history  EXAM: CHEST  2 VIEW  COMPARISON:  Chest x-ray of 04/28/2014  FINDINGS: Moderate cardiomegaly is stable. There may be minimal pulmonary vascular congestion present but no frank congestive heart failure or effusion is seen. AICD leads remain. A right PICC line is present with the tip seen to the lower SVC.  IMPRESSION: 1. Stable cardiomegaly with perhaps minimal pulmonary vascular congestion. No definite CHF or effusion. 2. AICD leads and right PICC line remain.   Electronically Signed   By: Dwyane Dee M.D.   On: 05/05/2014 16:43     EKG Interpretation None      Transferred care to Marlon Pel- PA-C  MDM  Vitals stable-WNL-SpO2 100% EKG not concerning , Cxr shows no acute abnormalities- stable from previous. Patient resting comfortably in ED, NAD,  Final diagnoses:  Acute on chronic systolic congestive heart failure   Meds given in ED:  Medications  albuterol (PROVENTIL) (2.5 MG/3ML) 0.083% nebulizer solution 2.5 mg (not administered)  atorvastatin (LIPITOR) tablet 40 mg  (40 mg Oral Given 05/06/14 0938)  pantoprazole (PROTONIX) EC tablet 40 mg (40 mg Oral Given 05/06/14 0937)  rivaroxaban (XARELTO) tablet 20 mg (not administered)  potassium chloride (K-DUR) CR tablet 10 mEq (10 mEq Oral Given 05/06/14 0937)  sertraline (ZOLOFT) tablet 50 mg (50 mg Oral Given 05/06/14 0937)  isosorbide mononitrate (IMDUR) 24 hr tablet 30 mg (30 mg Oral Given 05/06/14 0937)  carvedilol (COREG) tablet 3.125 mg (3.125 mg Oral Given 05/06/14 0823)  lisinopril (PRINIVIL,ZESTRIL) tablet 2.5 mg (2.5 mg Oral Given 05/06/14 0937)  acetaminophen (TYLENOL) tablet 650 mg (not administered)    Or  acetaminophen (TYLENOL) suppository 650 mg (not administered)  ondansetron (ZOFRAN) tablet 4 mg (not administered)    Or  ondansetron (ZOFRAN) injection 4 mg (not administered)  sodium chloride 0.9 % injection 3 mL (3 mLs Intravenous Given 05/05/14 2200)  morphine 2 MG/ML injection 1 mg (not administered)  aspirin EC tablet 81 mg (81 mg Oral Given 05/06/14 0937)  digoxin (LANOXIN) tablet 0.125 mg (0.125 mg Oral Given 05/06/14 0937)  nitroGLYCERIN (NITROSTAT) SL tablet 0.4 mg (not administered)  potassium chloride SA (K-DUR,KLOR-CON) CR tablet 20 mEq (not administered)  furosemide (LASIX) injection 80 mg (not administered)  spironolactone (ALDACTONE) tablet 12.5 mg (not administered)  ondansetron (ZOFRAN) injection 4 mg (4 mg Intravenous Given 05/05/14 1805)  morphine 4 MG/ML injection 4 mg (4 mg Intravenous Given 05/05/14 1805)  morphine 4 MG/ML injection 4 mg (4 mg Intravenous Given 05/05/14 1849)  furosemide (LASIX) injection 40 mg (40 mg Intravenous Given 05/05/14 2201)    Current Discharge Medication List     Prior to patient discharge, I discussed and reviewed this case with Dr.Gentry         Sharlene Motts, PA-C 05/06/14 1200

## 2014-05-05 NOTE — ED Notes (Signed)
Patient transported by [facility] for chest pain starting this morning around 0600.  Patient reports burning and pressure sensation across chest.  Pain is worse with inspiration and palpation per EMS.  Patient also complaint of shortness of breath.

## 2014-05-06 DIAGNOSIS — I2510 Atherosclerotic heart disease of native coronary artery without angina pectoris: Secondary | ICD-10-CM | POA: Diagnosis present

## 2014-05-06 DIAGNOSIS — E785 Hyperlipidemia, unspecified: Secondary | ICD-10-CM | POA: Diagnosis present

## 2014-05-06 DIAGNOSIS — K7460 Unspecified cirrhosis of liver: Secondary | ICD-10-CM | POA: Diagnosis present

## 2014-05-06 DIAGNOSIS — R079 Chest pain, unspecified: Secondary | ICD-10-CM | POA: Diagnosis present

## 2014-05-06 DIAGNOSIS — I428 Other cardiomyopathies: Secondary | ICD-10-CM | POA: Diagnosis present

## 2014-05-06 DIAGNOSIS — Z791 Long term (current) use of non-steroidal anti-inflammatories (NSAID): Secondary | ICD-10-CM | POA: Diagnosis not present

## 2014-05-06 DIAGNOSIS — I4891 Unspecified atrial fibrillation: Secondary | ICD-10-CM | POA: Diagnosis present

## 2014-05-06 DIAGNOSIS — Z8249 Family history of ischemic heart disease and other diseases of the circulatory system: Secondary | ICD-10-CM | POA: Diagnosis not present

## 2014-05-06 DIAGNOSIS — I059 Rheumatic mitral valve disease, unspecified: Secondary | ICD-10-CM | POA: Diagnosis present

## 2014-05-06 DIAGNOSIS — I509 Heart failure, unspecified: Secondary | ICD-10-CM | POA: Diagnosis present

## 2014-05-06 DIAGNOSIS — Z598 Other problems related to housing and economic circumstances: Secondary | ICD-10-CM | POA: Diagnosis not present

## 2014-05-06 DIAGNOSIS — Z91199 Patient's noncompliance with other medical treatment and regimen due to unspecified reason: Secondary | ICD-10-CM

## 2014-05-06 DIAGNOSIS — Z9119 Patient's noncompliance with other medical treatment and regimen: Secondary | ICD-10-CM | POA: Diagnosis not present

## 2014-05-06 DIAGNOSIS — I5023 Acute on chronic systolic (congestive) heart failure: Secondary | ICD-10-CM | POA: Diagnosis present

## 2014-05-06 DIAGNOSIS — Z9861 Coronary angioplasty status: Secondary | ICD-10-CM | POA: Diagnosis not present

## 2014-05-06 DIAGNOSIS — Z5987 Material hardship: Secondary | ICD-10-CM | POA: Diagnosis not present

## 2014-05-06 DIAGNOSIS — I2589 Other forms of chronic ischemic heart disease: Secondary | ICD-10-CM | POA: Diagnosis present

## 2014-05-06 DIAGNOSIS — F1210 Cannabis abuse, uncomplicated: Secondary | ICD-10-CM | POA: Diagnosis present

## 2014-05-06 DIAGNOSIS — D649 Anemia, unspecified: Secondary | ICD-10-CM | POA: Diagnosis present

## 2014-05-06 DIAGNOSIS — Z95810 Presence of automatic (implantable) cardiac defibrillator: Secondary | ICD-10-CM | POA: Diagnosis not present

## 2014-05-06 DIAGNOSIS — I10 Essential (primary) hypertension: Secondary | ICD-10-CM | POA: Diagnosis present

## 2014-05-06 DIAGNOSIS — I079 Rheumatic tricuspid valve disease, unspecified: Secondary | ICD-10-CM | POA: Diagnosis present

## 2014-05-06 DIAGNOSIS — F1010 Alcohol abuse, uncomplicated: Secondary | ICD-10-CM | POA: Diagnosis present

## 2014-05-06 DIAGNOSIS — F17200 Nicotine dependence, unspecified, uncomplicated: Secondary | ICD-10-CM | POA: Diagnosis present

## 2014-05-06 DIAGNOSIS — E876 Hypokalemia: Secondary | ICD-10-CM | POA: Diagnosis present

## 2014-05-06 DIAGNOSIS — Z888 Allergy status to other drugs, medicaments and biological substances status: Secondary | ICD-10-CM | POA: Diagnosis not present

## 2014-05-06 LAB — MAGNESIUM: Magnesium: 1.7 mg/dL (ref 1.5–2.5)

## 2014-05-06 LAB — BASIC METABOLIC PANEL
Anion gap: 13 (ref 5–15)
BUN: 16 mg/dL (ref 6–23)
CALCIUM: 8.9 mg/dL (ref 8.4–10.5)
CO2: 23 mEq/L (ref 19–32)
CREATININE: 0.97 mg/dL (ref 0.50–1.35)
Chloride: 103 mEq/L (ref 96–112)
GFR calc Af Amer: >90 mL/min (ref >90)
GLUCOSE: 117 mg/dL — AB (ref 70–99)
Potassium: 3.3 mEq/L — ABNORMAL LOW (ref 3.7–5.3)
Sodium: 139 mEq/L (ref 137–147)

## 2014-05-06 LAB — CARBOXYHEMOGLOBIN
CARBOXYHEMOGLOBIN: 1.9 % — AB (ref 0.5–1.5)
METHEMOGLOBIN: 0.8 % (ref 0.0–1.5)
O2 Saturation: 62.4 %
Total hemoglobin: 9 g/dL — ABNORMAL LOW (ref 13.5–18.0)

## 2014-05-06 LAB — TROPONIN I
Troponin I: <0.3 ng/mL (ref <0.30)
Troponin I: <0.3 ng/mL (ref <0.30)
Troponin I: <0.3 ng/mL (ref <0.30)

## 2014-05-06 LAB — CBC
HCT: 30 % — ABNORMAL LOW (ref 39.0–52.0)
HEMOGLOBIN: 9.3 g/dL — AB (ref 13.0–17.0)
MCH: 22.4 pg — ABNORMAL LOW (ref 26.0–34.0)
MCHC: 31 g/dL (ref 30.0–36.0)
MCV: 72.1 fL — AB (ref 78.0–100.0)
Platelets: 248 10*3/uL (ref 150–400)
RBC: 4.16 MIL/uL — AB (ref 4.22–5.81)
RDW: 19.4 % — ABNORMAL HIGH (ref 11.5–15.5)
WBC: 5.8 10*3/uL (ref 4.0–10.5)

## 2014-05-06 MED ORDER — SPIRONOLACTONE 12.5 MG HALF TABLET
12.5000 mg | ORAL_TABLET | Freq: Every day | ORAL | Status: DC
  Administered 2014-05-06 – 2014-05-10 (×5): 12.5 mg via ORAL
  Filled 2014-05-06 (×6): qty 1

## 2014-05-06 MED ORDER — POTASSIUM CHLORIDE CRYS ER 20 MEQ PO TBCR
20.0000 meq | EXTENDED_RELEASE_TABLET | Freq: Once | ORAL | Status: AC
  Administered 2014-05-06: 20 meq via ORAL
  Filled 2014-05-06: qty 1

## 2014-05-06 MED ORDER — NITROGLYCERIN 0.4 MG SL SUBL: 0.4000 mg | SUBLINGUAL_TABLET | SUBLINGUAL | Status: DC | PRN

## 2014-05-06 MED ORDER — POTASSIUM CHLORIDE CRYS ER 20 MEQ PO TBCR
20.0000 meq | EXTENDED_RELEASE_TABLET | Freq: Two times a day (BID) | ORAL | Status: DC
  Administered 2014-05-06 – 2014-05-11 (×9): 20 meq via ORAL
  Filled 2014-05-06 (×11): qty 1

## 2014-05-06 MED ORDER — MAGNESIUM SULFATE 40 MG/ML IJ SOLN
2.0000 g | Freq: Once | INTRAMUSCULAR | Status: AC
  Administered 2014-05-06: 2 g via INTRAVENOUS
  Filled 2014-05-06: qty 50

## 2014-05-06 MED ORDER — FUROSEMIDE 10 MG/ML IJ SOLN
80.0000 mg | Freq: Two times a day (BID) | INTRAMUSCULAR | Status: DC
  Administered 2014-05-06 – 2014-05-08 (×4): 80 mg via INTRAVENOUS
  Filled 2014-05-06 (×5): qty 8

## 2014-05-06 NOTE — Progress Notes (Signed)
CARDIAC REHAB PHASE I   PRE:  Rate/Rhythm: 81 pacing/afib    BP: sitting 116/62    SaO2: 100 RA  MODE:  Ambulation: 1050 ft   POST:  Rate/Rhythm: 85 pacing    BP: sitting 115/87     SaO2: 100 RA  Pt with quick pace, denied c/o. Sts he feels good. HR up at times on monitor but seemingly artifact. Will continue to follow. [id number]   Elissa Lovett [place] CES, ACSM 05/06/2014 2:15 PM

## 2014-05-06 NOTE — ED Provider Notes (Signed)
Medical screening examination/treatment/procedure(s) were conducted as a shared visit with non-physician practitioner(s) and myself.  I personally evaluated the patient during the encounter.   EKG Interpretation None      I performed an examination on the patient including cardiac, pulmonary, and gi systems which were unremarkable.  Pt has bibasilar rales.  Discussed with both PAs.  Cardiology consulted, and will fu.    Mirian Mo, MD 05/06/14 [phone]

## 2014-05-06 NOTE — Progress Notes (Signed)
[facility] PROGRESS NOTE  Tim Reyes DXA:[id number] DOB: [birth year] DOA: 05/05/2014 PCP: No primary provider on file.  Assessment/Plan: 1. Acute on chronic systolic CHF-Last EF 15%,  -continue diuresis with Lasix, negative 1.2 L overnight -Continue dig, Coreg, and nitrates -Cardiac enzymes negative X3 -Cardiology to follow for further recommendations 2. Chest pain -Troponins negative x3 -Continue Coreg, nitrates, statin -Cardiology to follow further recommendations 3. Atrial fibrillation -Rate controlled, continue xarelto 4. Tobacco abuse -Counseled to quit on admission 5.Chronic anemia -Hemoglobin stable, no gross bleeding>> follow 6. History of cirrhosis-states he quit drinking 7. Hypokalemia -Replace K. Code Status: Full Family Communication: None at bedside Disposition Plan: Transfer to telemetry when okay per cards   Consultants:  Cardiology  Procedures:  None  Antibiotics:  None  HPI/Subjective: States he feels better at today-denies chest pain and no shortness of breath at this time.  Objective: Filed Vitals:   05/06/14 0754  BP: 129/80  Pulse: 78  Temp: 97.6 F (36.4 C)  Resp:     Intake/Output Summary (Last 24 hours) at 05/06/14 0839 Last data filed at 05/06/14 0000  Gross per 24 hour  Intake      0 ml  Output   1200 ml  Net  -1200 ml   Filed Weights   05/05/14 1530 05/05/14 2055  Weight: 93.895 kg (207 lb) 87.8 kg (193 lb 9 oz)   Exam:  General: alert & oriented x 3 In NAD Cardiovascular: Irregular irregular, rate controlled, nl S1 s2 Respiratory: Decreased breath sounds at bases Abdomen: soft +BS NT/ND, no masses palpable Extremities: No cyanosis and no edema      Data Reviewed: Basic Metabolic Panel:  Recent Labs Lab 04/30/14 0100 05/05/14 1610 05/06/14 0320  NA 139 140 139  K 4.4 3.8 3.3*  CL 98 102 103  CO2 26 24 23   GLUCOSE 151* 106* 117*  BUN 24* 17 16  CREATININE 1.08 0.96 0.97  CALCIUM 9.3 9.3 8.9    Liver Function Tests:  Recent Labs Lab 05/05/14 1610  AST 23  ALT 15  ALKPHOS 91  BILITOT 3.9*  PROT 7.2  ALBUMIN 3.6   No results found for this basename: LIPASE, AMYLASE,  in the last 168 hours No results found for this basename: AMMONIA,  in the last 168 hours CBC:  Recent Labs Lab 04/30/14 0100 05/05/14 1610 05/06/14 0320  WBC 10.9* 5.4 5.8  NEUTROABS  --  4.6  --   HGB 9.1* 10.5* 9.3*  HCT 29.3* 33.1* 30.0*  MCV 72.9* 71.5* 72.1*  PLT 228 269 248   Cardiac Enzymes:  Recent Labs Lab 04/30/14 05/05/14 1922 05/06/14 0002 05/06/14 0400  TROPONINI <0.30 <0.30 <0.30 <0.30   BNP (last 3 results)  Recent Labs  04/20/14 2159 04/28/14 0811 05/05/14 1610  PROBNP 2601.0* 4523.0* 5876.0*   CBG: No results found for this basename: GLUCAP,  in the last 168 hours  Recent Results (from the past 240 hour(s))  MRSA PCR SCREENING     Status: None   Collection Time    05/05/14  8:59 PM      Result Value Ref Range Status   MRSA by PCR NEGATIVE  NEGATIVE Final   Comment:            The GeneXpert MRSA Assay (FDA     approved for NASAL specimens     only), is one component of a     comprehensive MRSA colonization     surveillance program. It is not  intended to diagnose MRSA     infection nor to guide or     monitor treatment for     MRSA infections.     Studies: Dg Chest 2 View  05/05/2014   CLINICAL DATA:  Chest pain, recent shortness of breath, smoking history  EXAM: CHEST  2 VIEW  COMPARISON:  Chest x-ray of 04/28/2014  FINDINGS: Moderate cardiomegaly is stable. There may be minimal pulmonary vascular congestion present but no frank congestive heart failure or effusion is seen. AICD leads remain. A right PICC line is present with the tip seen to the lower SVC.  IMPRESSION: 1. Stable cardiomegaly with perhaps minimal pulmonary vascular congestion. No definite CHF or effusion. 2. AICD leads and right PICC line remain.   Electronically Signed   By: Dwyane DeePaul  Barry  M.D.   On: 05/05/2014 16:43    Scheduled Meds: . aspirin EC  81 mg Oral Daily  . atorvastatin  40 mg Oral Daily  . carvedilol  3.125 mg Oral BID WC  . digoxin  0.125 mg Oral Daily  . furosemide  40 mg Oral BID  . isosorbide mononitrate  30 mg Oral Daily  . lisinopril  2.5 mg Oral Daily  . pantoprazole  40 mg Oral Daily  . potassium chloride  10 mEq Oral BID  . rivaroxaban  20 mg Oral Q supper  . sertraline  50 mg Oral Daily  . sodium chloride  3 mL Intravenous Q12H   Continuous Infusions:   Principal Problem:   Chest pain with moderate risk of acute coronary syndrome Active Problems:   Chronic systolic congestive heart failure, NYHA class 4   Chronic atrial fibrillation   HTN (hypertension)   CAD- PCI 2005, cath 2012 OK   Non compliance w medication regimen   Liver cirrhosis   Tobacco abuse   AICD (automatic cardioverter/defibrillator) present   Chest pain    Time spent:35    Tim Reyes,Perlita Forbush C  [facility] Pager [phone]. If 7PM-7AM, please contact night-coverage at [email or web address], password [email or web address] 05/06/2014, 8:39 AM  LOS: 1 day

## 2014-05-06 NOTE — Care Management Note (Addendum)
    Page 1 of 2   05/11/2014     8:53:29 AM CARE MANAGEMENT NOTE 05/11/2014  Patient:  [facility]   Account Number:  [id number]  Date Initiated:  05/06/2014  Documentation initiated by:  Junius Creamer  Subjective/Objective Assessment:   adm w ch pain     Action/Plan:   lives alone   Anticipated DC Date:  05/11/2014   Anticipated DC Plan:  HOME W HOME HEALTH SERVICES      DC Planning Services  CM consult      [facility] Choice  Resumption Of Svcs/PTA Provider   Choice offered to / List presented to:     DME arranged  IV PUMP/EQUIPMENT      DME agency  [facility]     [facility] arranged  HH-1 RN  HH-10 DISEASE MANAGEMENT      HH agency  [facility]   Status of service:   Medicare Important Message given?  YES (If response is "NO", the following Medicare IM given date fields will be blank) Date Medicare IM given:  05/09/2014 Medicare IM given by:  Junius Creamer Date Additional Medicare IM given:   Additional Medicare IM given by:    Discharge Disposition:  HOME W HOME HEALTH SERVICES  Per UR Regulation:  Reviewed for med. necessity/level of care/duration of stay  If discussed at Long Length of Stay Meetings, dates discussed:   05/10/2014  05/12/2014    Comments:  8/26  0852 debbie Crosley Stejskal rn,bsn have alerted pam and donna w [facility] of pt dc home today w iv milrinone. [facility] will mix and hook pt up prior to disch.  8/21 1244p debbie Kimimila Tauzin rn, bsn alertd pam w [facility] of poss of new home milrinone. spoke w pt. he is visiting in [place] and will be here til first of september. he is act w [facility] here in town. his plan is to return to [place] next month and resume is 3x/wk outpt dobutamine at local hosp there. will follow and assist as pt progresses. if needs milrinone [facility] may be able to mix but will need to find hhc agency in his area to adm med.

## 2014-05-06 NOTE — ED Provider Notes (Signed)
Medical screening examination/treatment/procedure(s) were conducted as a shared visit with non-physician practitioner(s) and myself.  I personally evaluated the patient during the encounter.   EKG Interpretation None       I performed an examination on the patient including cardiac, pulmonary, and gi systems which were unremarkable except as documented by PA.   Mirian Mo, MD 05/06/14 1600

## 2014-05-06 NOTE — Progress Notes (Signed)
Advanced Heart Failure Rounding Note  Primary Cardiologist: Dr. Lahoma Reyes, [facility], Tim Reyes)  Subjective:    Tim Re[place] a 48 yo [place] a history of mixed ICM/NICM s/p ICD (Biotronik), chronic systolic heart failure, CAD s/p angioplasty x 1 vessel (2005), chronic atrial fibrillation, HTN, OSA and history of non-compliance.  Last cardiac catheterization Feb 2012: 20% LCx & RCA  Patient reports that he came up to visit his stepdaughter and since then has had issues with his CHF. He has a PICC and usually goes to his local hospital 3 x a week for dobutamine through short stay for a couple hours. Reports that before coming here has not needed multiple admits for CHF in his hometown. Reports taking medications as prescribed now but when he first arrived in town ran out of them and got admitted.   Patient has been admitted 3x in the past month and he has been to the ED 4 times in past week for CP and SOB. Presented to the ED on 8/20 with CP. Labs on admission were pro-BNP 5800, negative troponin, Hgb 10.5 and creatinine 1.08.  ECHO EF 15%, diff HK, biatrial enlargement, mod/severe MR, severe TR, RV sys fx mildly dilated  Diuresing on IV lasix, 24 hr I/O -1.2 liters. Denies SOB, orthopnea or CP. Renal fx stable and K+ 3.3.  Objective:   Weight Range:  Vital Signs:   Temp:  [97.6 F (36.4 C)-98.2 F (36.8 C)] 97.6 F (36.4 C) (08/21 0754) Pulse Rate:  [41-90] 78 (08/21 0754) Resp:  [11-27] 27 (08/20 2055) BP: (95-129)/(63-95) 129/80 mmHg (08/21 0754) SpO2:  [98 %-100 %] 99 % (08/21 0754) Weight:  [193 lb 9 oz (87.8 kg)-207 lb (93.895 kg)] 193 lb 9 oz (87.8 kg) (08/20 2055)    Weight change: Filed Weights   05/05/14 1530 05/05/14 2055  Weight: 207 lb (93.895 kg) 193 lb 9 oz (87.8 kg)    Intake/Output:   Intake/Output Summary (Last 24 hours) at 05/06/14 1030 Last data filed at 05/06/14 0000  Gross per 24 hour  Intake       0 ml  Output   1200 ml  Net  -1200 ml     Physical Exam: General:  Well appearing. No resp difficulty, lying flat in bed HEENT: normal Neck: supple. JVP 12 . Carotids 2+ bilat; no bruits. No lymphadenopathy or thryomegaly appreciated. Cor: PMI nondisplaced. Regular rate & irregular rhythm. +s3, 3/6 TR and 3/6 MR Lungs: clear Abdomen: soft, nontender, nondistended. No hepatosplenomegaly. No bruits or masses. Good bowel sounds. Extremities: no cyanosis, clubbing, rash, edema, R 2L PICC Neuro: alert & orientedx3, cranial nerves grossly intact. moves all 4 extremities w/o difficulty. Affect pleasant  Telemetry:   Labs: Basic Metabolic Panel:  Recent Labs Lab 04/30/14 0100 05/05/14 1610 05/06/14 0320  NA 139 140 139  K 4.4 3.8 3.3*  CL 98 102 103  CO2 26 24 23   GLUCOSE 151* 106* 117*  BUN 24* 17 16  CREATININE 1.08 0.96 0.97  CALCIUM 9.3 9.3 8.9    Liver Function Tests:  Recent Labs Lab 05/05/14 1610  AST 23  ALT 15  ALKPHOS 91  BILITOT 3.9*  PROT 7.2  ALBUMIN 3.6   No results found for this basename: LIPASE, AMYLASE,  in the last 168 hours No results found for this basename: AMMONIA,  in the last 168 hours  CBC:  Recent Labs Lab 04/30/14 0100 05/05/14 1610 05/06/14 0320  WBC  10.9* 5.4 5.8  NEUTROABS  --  4.6  --   HGB 9.1* 10.5* 9.3*  HCT 29.3* 33.1* 30.0*  MCV 72.9* 71.5* 72.1*  PLT 228 269 248    Cardiac Enzymes:  Recent Labs Lab 04/30/14 05/05/14 1922 05/06/14 0002 05/06/14 0400  TROPONINI <0.30 <0.30 <0.30 <0.30    BNP: BNP (last 3 results)  Recent Labs  04/20/14 2159 04/28/14 0811 05/05/14 1610  PROBNP 2601.0* 4523.0* 5876.0*     Other results:  EKG: afib with V paced  Imaging: Dg Chest 2 View  05/05/2014   CLINICAL DATA:  Chest pain, recent shortness of breath, smoking history  EXAM: CHEST  2 VIEW  COMPARISON:  Chest x-ray of 04/28/2014  FINDINGS: Moderate cardiomegaly is stable. There may be minimal pulmonary vascular  congestion present but no frank congestive heart failure or effusion is seen. AICD leads remain. A right PICC line is present with the tip seen to the lower SVC.  IMPRESSION: 1. Stable cardiomegaly with perhaps minimal pulmonary vascular congestion. No definite CHF or effusion. 2. AICD leads and right PICC line remain.   Electronically Signed   By: Tim Reyes M.D.   On: 05/05/2014 16:43      Medications:     Scheduled Medications: . aspirin EC  81 mg Oral Daily  . atorvastatin  40 mg Oral Daily  . carvedilol  3.125 mg Oral BID WC  . digoxin  0.125 mg Oral Daily  . furosemide  40 mg Oral BID  . isosorbide mononitrate  30 mg Oral Daily  . lisinopril  2.5 mg Oral Daily  . pantoprazole  40 mg Oral Daily  . potassium chloride  10 mEq Oral BID  . potassium chloride  20 mEq Oral Once  . rivaroxaban  20 mg Oral Q supper  . sertraline  50 mg Oral Daily  . sodium chloride  3 mL Intravenous Q12H     Infusions:     PRN Medications:  acetaminophen, acetaminophen, albuterol, morphine injection, nitroGLYCERIN, ondansetron (ZOFRAN) IV, ondansetron   Assessment:   1) A/C systolic HF -EF 15% 2) NICM/ICM - s/p ICD Biotronik  3) Hypokalemia 4) HTN 5) Frequent PVCs 6) CAD - s/p angioplasty x 1 vessel 2005 7) Atrial fibrillation 8) Mod/severe MR 9) Severe TR   Plan/Discussion:    [name] is a very pleasant 48 yo male with a history of chronic systolic HF, mixed ICM/NICM s/p ICD, HTN and atrial fibrillation. He is followed in [facility] by a cardiologist and reports going 3x a week to local hospital to get IV dobutamine for a couple of hours through his PICC. He was traveling here to see family and since being up here has need frequent readmits and has been to the ED multiple times for CP and CHF.  His volume status remains elevated will increase lasix to 80 mg IV BID. Continue low dose BB 3.125 mg BID will not titrate currently. Check co-ox off PICC, if low will need to likely start  milrinone and can discuss with [facility] and see if they have an affilate down where he lives, if not could look into [employer] service. Check CVPs.  Continue lisinopril 2.5 mg daily. Start spironolactone 12.5 mg daily.   Will call Biotronik to see if they can interrogate his ICD. Will continue Xarelto 20 mg daily for atrial fibrillation.   On digoxin 0.125 mg and level ok.    Consult CR for ambulation.    Length of Stay: 1  Tim Reyes, Tim B NP-C 05/06/2014, 10:30 AM  Advanced Heart Failure Team Pager [phone] (M-F; 7a - 4p)  Please contact [facility] for night-coverage after hours (4p -7a ) and weekends on [email or web address]  Patient seen and examined with Tim PotashAli Cosgrove, NP. We discussed all aspects of the encounter. I agree with the assessment and plan as stated above.   He has progressive, advanced HF with NYHA IIIB due primarily to a NICM CM with EF 15%. He also has AF. He has managed [place] He now has had several HF readmits while visiting family in [place]. No one has ever discussed advanced therapies with him. On exam has prominent s3 and volume overload. Will check CVP and co-ox via PICC. Continue diuresis. If co-ox low will likely need L/RHC on Monday followed by chronic outpatient administration of inotropes.  Will have Biotronik interrogate device to see duration of AF and amount of RV pacing. May benefit from CRT upgrade.  Long talk about possible need for work-up for advanced therapies and he would like to do this at [facility] in [place], [place].  HF team will manage while in house.   Daniel Bensimhon,MD 12:05 PM

## 2014-05-07 LAB — BASIC METABOLIC PANEL
Anion gap: 13 (ref 5–15)
BUN: 18 mg/dL (ref 6–23)
CALCIUM: 8.9 mg/dL (ref 8.4–10.5)
CO2: 27 meq/L (ref 19–32)
CREATININE: 1.18 mg/dL (ref 0.50–1.35)
Chloride: 101 mEq/L (ref 96–112)
GFR calc Af Amer: 83 mL/min — ABNORMAL LOW (ref >90)
GFR calc non Af Amer: 71 mL/min — ABNORMAL LOW (ref >90)
Glucose, Bld: 89 mg/dL (ref 70–99)
Potassium: 3.7 mEq/L (ref 3.7–5.3)
Sodium: 141 mEq/L (ref 137–147)

## 2014-05-07 LAB — CARBOXYHEMOGLOBIN
CARBOXYHEMOGLOBIN: 1.3 % (ref 0.5–1.5)
Carboxyhemoglobin: 1.2 % (ref 0.5–1.5)
METHEMOGLOBIN: 0.6 % (ref 0.0–1.5)
Methemoglobin: 0.8 % (ref 0.0–1.5)
O2 Saturation: 50.5 %
O2 Saturation: 40.3 %
TOTAL HEMOGLOBIN: 9.3 g/dL — AB (ref 13.5–18.0)
Total hemoglobin: 9.4 g/dL — ABNORMAL LOW (ref 13.5–18.0)

## 2014-05-07 NOTE — Progress Notes (Addendum)
[facility] PROGRESS NOTE  Faye RamsayDavid Bottari NWG:[id number] DOB: 10/26/1965 DOA: 05/05/2014 PCP: No primary provider on file.  Assessment/Plan: 1. Acute on chronic systolic CHF-Last EF 15%,  -Read her diary is with IV Lasix overnight, greater than 2 L>> follow -Continue dig, Coreg, and nitrates -Cardiac enzymes negative X3 -Cardiology to follow and decide on ?milrinone 2. Chest pain -Troponins negative x3 -Continue Coreg, nitrates, statin -Cardiology to follow further recommendations 3. Atrial fibrillation -Rate controlled, continue xarelto 4. Tobacco abuse -Counseled to quit on admission 5.Chronic anemia -Hemoglobin stable, no gross bleeding>> follow 6. History of cirrhosis-states he quit drinking 7. Hypokalemia -Secondary to diuretics, resolved. Code Status: Full Family Communication: None at bedside Disposition Plan: Transfer to telemetry when okay per cards   Consultants:  Cardiology  Procedures:  None  Antibiotics:  None  HPI/Subjective: States he had chest pain earlier this a.m., denies shortness of breath Objective: Filed Vitals:   05/07/14 0832  BP: 107/74  Pulse:   Temp: 98.6 F (37 C)  Resp: 25    Intake/Output Summary (Last 24 hours) at 05/07/14 0902 Last data filed at 05/07/14 0400  Gross per 24 hour  Intake    690 ml  Output   3450 ml  Net  -2760 ml   Filed Weights   05/05/14 1530 05/05/14 2055 05/07/14 0400  Weight: 93.895 kg (207 lb) 87.8 kg (193 lb 9 oz) 85.9 kg (189 lb 6 oz)   Exam:  General: alert & oriented x 3 In NAD Cardiovascular: Irregular irregular, rate controlled, nl S1 s2 Respiratory: Decreased breath sounds at bases Abdomen: soft +BS NT/ND, no masses palpable Extremities: No cyanosis and no edema      Data Reviewed: Basic Metabolic Panel:  Recent Labs Lab 05/05/14 1610 05/06/14 0320 05/06/14 1020 05/07/14 0404  NA 140 139  --  141  K 3.8 3.3*  --  3.7  CL 102 103  --  101  CO2 24 23  --  27  GLUCOSE 106*  117*  --  89  BUN 17 16  --  18  CREATININE 0.96 0.97  --  1.18  CALCIUM 9.3 8.9  --  8.9  MG  --   --  1.7  --    Liver Function Tests:  Recent Labs Lab 05/05/14 1610  AST 23  ALT 15  ALKPHOS 91  BILITOT 3.9*  PROT 7.2  ALBUMIN 3.6   No results found for this basename: LIPASE, AMYLASE,  in the last 168 hours No results found for this basename: AMMONIA,  in the last 168 hours CBC:  Recent Labs Lab 05/05/14 1610 05/06/14 0320  WBC 5.4 5.8  NEUTROABS 4.6  --   HGB 10.5* 9.3*  HCT 33.1* 30.0*  MCV 71.5* 72.1*  PLT 269 248   Cardiac Enzymes:  Recent Labs Lab 05/05/14 1922 05/06/14 0002 05/06/14 0400 05/06/14 1020  TROPONINI <0.30 <0.30 <0.30 <0.30   BNP (last 3 results)  Recent Labs  04/20/14 2159 04/28/14 0811 05/05/14 1610  PROBNP 2601.0* 4523.0* 5876.0*   CBG: No results found for this basename: GLUCAP,  in the last 168 hours  Recent Results (from the past 240 hour(s))  MRSA PCR SCREENING     Status: None   Collection Time    05/05/14  8:59 PM      Result Value Ref Range Status   MRSA by PCR NEGATIVE  NEGATIVE Final   Comment:            The GeneXpert MRSA  Assay (FDA     approved for NASAL specimens     only), is one component of a     comprehensive MRSA colonization     surveillance program. It is not     intended to diagnose MRSA     infection nor to guide or     monitor treatment for     MRSA infections.     Studies: Dg Chest 2 View  05/05/2014   CLINICAL DATA:  Chest pain, recent shortness of breath, smoking history  EXAM: CHEST  2 VIEW  COMPARISON:  Chest x-ray of 04/28/2014  FINDINGS: Moderate cardiomegaly is stable. There may be minimal pulmonary vascular congestion present but no frank congestive heart failure or effusion is seen. AICD leads remain. A right PICC line is present with the tip seen to the lower SVC.  IMPRESSION: 1. Stable cardiomegaly with perhaps minimal pulmonary vascular congestion. No definite CHF or effusion. 2. AICD  leads and right PICC line remain.   Electronically Signed   By: Dwyane Dee M.D.   On: 05/05/2014 16:43    Scheduled Meds: . aspirin EC  81 mg Oral Daily  . atorvastatin  40 mg Oral Daily  . carvedilol  3.125 mg Oral BID WC  . digoxin  0.125 mg Oral Daily  . furosemide  80 mg Intravenous BID  . isosorbide mononitrate  30 mg Oral Daily  . lisinopril  2.5 mg Oral Daily  . pantoprazole  40 mg Oral Daily  . potassium chloride  20 mEq Oral BID  . rivaroxaban  20 mg Oral Q supper  . sertraline  50 mg Oral Daily  . sodium chloride  3 mL Intravenous Q12H  . spironolactone  12.5 mg Oral Daily   Continuous Infusions:   Principal Problem:   Chest pain with moderate risk of acute coronary syndrome Active Problems:   Chronic systolic congestive heart failure, NYHA class 4   Chronic atrial fibrillation   HTN (hypertension)   CAD- PCI 2005, cath 2012 OK   Non compliance w medication regimen   Liver cirrhosis   Tobacco abuse   AICD (automatic cardioverter/defibrillator) present   Chest pain   Atrial fibrillation    Time spent:25    Kela Millin  [facility] Pager [phone]. If 7PM-7AM, please contact night-coverage at [email or web address], password [facility] 05/07/2014, 9:02 AM  LOS: 2 days              Not a

## 2014-05-07 NOTE — Progress Notes (Signed)
CARDIAC REHAB PHASE I   PRE:  Rate/Rhythm: 80 paced  BP:  Supine:   Sitting: 107/74  Standing:    SaO2: 97%RA  MODE:  Ambulation: 1400 ft   POST:  Rate/Rhythm: 96-101 paced and few PVCs  BP:  Supine:   Sitting: 117/71  Standing:    SaO2: 99%RA 0850-0910 Pt walked 1400 ft independently on RA with steady fast pace. No DOE. Tolerated well. Second walk for today. Sitting on side of bed after walk.   Luetta Nutting, RN BSN  05/07/2014 9:05 AM

## 2014-05-07 NOTE — Progress Notes (Addendum)
Advanced Heart Failure Rounding Note  Primary Cardiologist: Dr. Lahoma Rocker, [facility], [place])  Subjective:    Tim Reyes is a 48 yo [place] a history of mixed ICM/NICM s/p ICD (Biotronik), chronic systolic heart failure, CAD s/p angioplasty x 1 vessel (2005), chronic atrial fibrillation, HTN, OSA and history of non-compliance.  Last cardiac catheterization Feb 2012: 20% LCx & RCA  ECHO EF 15%, diff HK, biatrial enlargement, mod/severe MR, severe TR, RV sys fx mildly dilated  Co-ox 62% initially but now 50%. Walking halls with mild dyspnea. Diuresed well. Weight down 4 pounds.   ICD interrogated shows chronic AF with 62% RV pacing. CVP 19  Objective:   Weight Range:  Vital Signs:   Temp:  [97.2 F (36.2 C)-98.6 F (37 C)] 98.6 F (37 C) (08/22 0832) Pulse Rate:  [59-127] 74 (08/22 0400) Resp:  [17-25] 25 (08/22 0832) BP: (106-130)/(62-95) 107/74 mmHg (08/22 0832) SpO2:  [99 %-100 %] 99 % (08/22 0832) Weight:  [85.9 kg (189 lb 6 oz)] 85.9 kg (189 lb 6 oz) (08/22 0400)    Weight change: Filed Weights   05/05/14 1530 05/05/14 2055 05/07/14 0400  Weight: 93.895 kg (207 lb) 87.8 kg (193 lb 9 oz) 85.9 kg (189 lb 6 oz)    Intake/Output:   Intake/Output Summary (Last 24 hours) at 05/07/14 0940 Last data filed at 05/07/14 0400  Gross per 24 hour  Intake    690 ml  Output   3450 ml  Net  -2760 ml    CVP 19 with prominent v-waves Physical Exam: General:  No resp difficulty, lying flat in bed HEENT: normal Neck: supple. JVP ear Carotids 2+ bilat; no bruits. No lymphadenopathy or thryomegaly appreciated. Cor: PMI nondisplaced. Regular rate & irregular rhythm. +s3, 3/6 TR and 3/6 MR Lungs: clear Abdomen: soft, nontender, nondistended. No hepatosplenomegaly. No bruits or masses. Good bowel sounds. Extremities: no cyanosis, clubbing, rash, edema, R 2L PICC Neuro: alert & orientedx3, cranial nerves grossly intact. moves  all 4 extremities w/o difficulty. Affect pleasant  Telemetry: AF with v-pacing  Labs: Basic Metabolic Panel:  Recent Labs Lab 05/05/14 1610 05/06/14 0320 05/06/14 1020 05/07/14 0404  NA 140 139  --  141  K 3.8 3.3*  --  3.7  CL 102 103  --  101  CO2 24 23  --  27  GLUCOSE 106* 117*  --  89  BUN 17 16  --  18  CREATININE 0.96 0.97  --  1.18  CALCIUM 9.3 8.9  --  8.9  MG  --   --  1.7  --     Liver Function Tests:  Recent Labs Lab 05/05/14 1610  AST 23  ALT 15  ALKPHOS 91  BILITOT 3.9*  PROT 7.2  ALBUMIN 3.6   No results found for this basename: LIPASE, AMYLASE,  in the last 168 hours No results found for this basename: AMMONIA,  in the last 168 hours  CBC:  Recent Labs Lab 05/05/14 1610 05/06/14 0320  WBC 5.4 5.8  NEUTROABS 4.6  --   HGB 10.5* 9.3*  HCT 33.1* 30.0*  MCV 71.5* 72.1*  PLT 269 248    Cardiac Enzymes:  Recent Labs Lab 05/05/14 1922 05/06/14 0002 05/06/14 0400 05/06/14 1020  TROPONINI <0.30 <0.30 <0.30 <0.30    BNP: BNP (last 3 results)  Recent Labs  04/20/14 2159 04/28/14 0811 05/05/14 1610  PROBNP 2601.0* 4523.0* 5876.0*     Other  results:  EKG: afib with V paced  Imaging: Dg Chest 2 View  05/05/2014   CLINICAL DATA:  Chest pain, recent shortness of breath, smoking history  EXAM: CHEST  2 VIEW  COMPARISON:  Chest x-ray of 04/28/2014  FINDINGS: Moderate cardiomegaly is stable. There may be minimal pulmonary vascular congestion present but no frank congestive heart failure or effusion is seen. AICD leads remain. A right PICC line is present with the tip seen to the lower SVC.  IMPRESSION: 1. Stable cardiomegaly with perhaps minimal pulmonary vascular congestion. No definite CHF or effusion. 2. AICD leads and right PICC line remain.   Electronically Signed   By: Dwyane DeePaul  Barry M.D.   On: 05/05/2014 16:43     Medications:     Scheduled Medications: . aspirin EC  81 mg Oral Daily  . atorvastatin  40 mg Oral Daily  .  carvedilol  3.125 mg Oral BID WC  . digoxin  0.125 mg Oral Daily  . furosemide  80 mg Intravenous BID  . isosorbide mononitrate  30 mg Oral Daily  . lisinopril  2.5 mg Oral Daily  . pantoprazole  40 mg Oral Daily  . potassium chloride  20 mEq Oral BID  . rivaroxaban  20 mg Oral Q supper  . sertraline  50 mg Oral Daily  . sodium chloride  3 mL Intravenous Q12H  . spironolactone  12.5 mg Oral Daily    Infusions:    PRN Medications: acetaminophen, acetaminophen, albuterol, morphine injection, nitroGLYCERIN, ondansetron (ZOFRAN) IV, ondansetron   Assessment:   1) A/C systolic HF -EF 15% 2) NICM/ICM - s/p ICD Biotronik  3) Hypokalemia 4) HTN 5) Frequent PVCs 6) CAD - s/p angioplasty x 1 vessel 2005 7) Atrial fibrillation 8) Mod/severe MR 9) Severe TR   Plan/Discussion:    He has progressive, advanced HF with NYHA III-IIIB due primarily to a NICM CM with EF 15%. He also has AF. He has managed with intermittent outpatient administration [place] in [place]. He now has had several HF readmits while visiting family in [place]. No one has ever discussed advanced therapies with him. On exam has prominent s3 and volume overload.   Co-ox is borderline. Will repeat today. CVP remains markedly i- this is confounded by severe TR. Will continue diuresis as renal function tolerates. Plan L/RHC on Monday to help decide on need for chronic home inotropes. Hold Xarelto on Sunday night.   Biotronik interrogation reviewed. He has chronic AF and > 60% RV pacing. May benefit from CRT upgrade. Will d/w EP.   Long talk about possible need for work-up for advanced therapies and he would like to do this at [facility] in [place], [place].  [name],MD 9:40 AM

## 2014-05-08 LAB — CARBOXYHEMOGLOBIN
CARBOXYHEMOGLOBIN: 1.3 % (ref 0.5–1.5)
METHEMOGLOBIN: 0.8 % (ref 0.0–1.5)
O2 Saturation: 49.7 %
Total hemoglobin: 9.2 g/dL — ABNORMAL LOW (ref 13.5–18.0)

## 2014-05-08 LAB — BASIC METABOLIC PANEL
ANION GAP: 11 (ref 5–15)
BUN: 20 mg/dL (ref 6–23)
CHLORIDE: 99 meq/L (ref 96–112)
CO2: 29 mEq/L (ref 19–32)
CREATININE: 1.27 mg/dL (ref 0.50–1.35)
Calcium: 8.9 mg/dL (ref 8.4–10.5)
GFR, EST AFRICAN AMERICAN: 76 mL/min — AB (ref >90)
GFR, EST NON AFRICAN AMERICAN: 65 mL/min — AB (ref >90)
Glucose, Bld: 92 mg/dL (ref 70–99)
Potassium: 3.6 mEq/L — ABNORMAL LOW (ref 3.7–5.3)
Sodium: 139 mEq/L (ref 137–147)

## 2014-05-08 LAB — TROPONIN I: Troponin I: <0.3 ng/mL (ref <0.30)

## 2014-05-08 MED ORDER — SODIUM CHLORIDE 0.9 % IJ SOLN: 3.0000 mL | INTRAMUSCULAR | Status: DC | PRN

## 2014-05-08 MED ORDER — SODIUM CHLORIDE 0.9 % IV SOLN: 250.0000 mL | INTRAVENOUS | Status: DC | PRN

## 2014-05-08 MED ORDER — FENTANYL CITRATE 0.05 MG/ML IJ SOLN
25.0000 ug | Freq: Once | INTRAMUSCULAR | Status: AC
  Administered 2014-05-08: 25 ug via INTRAVENOUS

## 2014-05-08 MED ORDER — ASPIRIN 81 MG PO CHEW
81.0000 mg | CHEWABLE_TABLET | ORAL | Status: AC
  Administered 2014-05-09: 81 mg via ORAL
  Filled 2014-05-08: qty 1

## 2014-05-08 MED ORDER — SODIUM CHLORIDE 0.9 % IJ SOLN: 3.0000 mL | Freq: Two times a day (BID) | INTRAMUSCULAR | Status: DC

## 2014-05-08 MED ORDER — RIVAROXABAN 20 MG PO TABS
20.0000 mg | ORAL_TABLET | Freq: Every day | ORAL | Status: DC
  Filled 2014-05-08: qty 1

## 2014-05-08 MED ORDER — FENTANYL CITRATE 0.05 MG/ML IJ SOLN
INTRAMUSCULAR | Status: AC
  Filled 2014-05-08: qty 2

## 2014-05-08 NOTE — Progress Notes (Signed)
[facility] PROGRESS NOTE  Juron Vorhees ZOX:[id number] DOB: 1965-10-30 DOA: 05/05/2014 PCP: No primary provider on file.  Assessment/Plan: 1. Acute on chronic systolic CHF-Last EF 15%,  -continuing to diurese with IV Lasix overnight, 1.3L neg>> follow -Continue dig, Coreg, and nitrates -Cardiac enzymes negative X3 -cardiac cath planned in am -Cardiology to follow and decide on ?milrinone 2. Chest pain -Troponins negative x3 -Continue Coreg, nitrates, statin -Cardiology to follow further recommendations 3. Atrial fibrillation -Rate controlled, continue xarelto 4. Tobacco abuse -Counseled to quit on admission 5.Chronic anemia -Hemoglobin stable, no gross bleeding>> follow 6. History of cirrhosis-states he quit drinking 7. Hypokalemia -Secondary to diuretics, resolved. Code Status: Full Family Communication: None at bedside Disposition Plan: Transfer to telemetry when okay per cards   Consultants:  Cardiology  Procedures:  None  Antibiotics:  None  HPI/Subjective: denies any new complaints. Objective: Filed Vitals:   05/08/14 0827  BP: 108/78  Pulse: 75  Temp: 98.2 F (36.8 C)  Resp: 20    Intake/Output Summary (Last 24 hours) at 05/08/14 0915 Last data filed at 05/08/14 0400  Gross per 24 hour  Intake   1540 ml  Output   2875 ml  Net  -1335 ml   Filed Weights   05/05/14 2055 05/07/14 0400 05/08/14 0345  Weight: 87.8 kg (193 lb 9 oz) 85.9 kg (189 lb 6 oz) 84.5 kg (186 lb 4.6 oz)   Exam:  General: alert & oriented x 3 In NAD Cardiovascular: Irregular irregular, rate controlled, nl S1 s2 Respiratory: Decreased breath sounds at bases Abdomen: soft +BS NT/ND, no masses palpable Extremities: No cyanosis and no edema      Data Reviewed: Basic Metabolic Panel:  Recent Labs Lab 05/05/14 1610 05/06/14 0320 05/06/14 1020 05/07/14 0404 05/08/14 0355  NA 140 139  --  141 139  K 3.8 3.3*  --  3.7 3.6*  CL 102 103  --  101 99  CO2 24 23  --   27 29  GLUCOSE 106* 117*  --  89 92  BUN 17 16  --  18 20  CREATININE 0.96 0.97  --  1.18 1.27  CALCIUM 9.3 8.9  --  8.9 8.9  MG  --   --  1.7  --   --    Liver Function Tests:  Recent Labs Lab 05/05/14 1610  AST 23  ALT 15  ALKPHOS 91  BILITOT 3.9*  PROT 7.2  ALBUMIN 3.6   No results found for this basename: LIPASE, AMYLASE,  in the last 168 hours No results found for this basename: AMMONIA,  in the last 168 hours CBC:  Recent Labs Lab 05/05/14 1610 05/06/14 0320  WBC 5.4 5.8  NEUTROABS 4.6  --   HGB 10.5* 9.3*  HCT 33.1* 30.0*  MCV 71.5* 72.1*  PLT 269 248   Cardiac Enzymes:  Recent Labs Lab 05/05/14 1922 05/06/14 0002 05/06/14 0400 05/06/14 1020  TROPONINI <0.30 <0.30 <0.30 <0.30   BNP (last 3 results)  Recent Labs  04/20/14 2159 04/28/14 0811 05/05/14 1610  PROBNP 2601.0* 4523.0* 5876.0*   CBG: No results found for this basename: GLUCAP,  in the last 168 hours  Recent Results (from the past 240 hour(s))  MRSA PCR SCREENING     Status: None   Collection Time    05/05/14  8:59 PM      Result Value Ref Range Status   MRSA by PCR NEGATIVE  NEGATIVE Final   Comment:  The GeneXpert MRSA Assay (FDA     approved for NASAL specimens     only), is one component of a     comprehensive MRSA colonization     surveillance program. It is not     intended to diagnose MRSA     infection nor to guide or     monitor treatment for     MRSA infections.     Studies: No results found.  Scheduled Meds: . atorvastatin  40 mg Oral Daily  . carvedilol  3.125 mg Oral BID WC  . digoxin  0.125 mg Oral Daily  . furosemide  80 mg Intravenous BID  . isosorbide mononitrate  30 mg Oral Daily  . lisinopril  2.5 mg Oral Daily  . pantoprazole  40 mg Oral Daily  . potassium chloride  20 mEq Oral BID  . [START ON 05/09/2014] rivaroxaban  20 mg Oral Q supper  . sertraline  50 mg Oral Daily  . sodium chloride  3 mL Intravenous Q12H  . spironolactone  12.5  mg Oral Daily   Continuous Infusions:   Principal Problem:   Chest pain with moderate risk of acute coronary syndrome Active Problems:   Chronic systolic congestive heart failure, NYHA class 4   Chronic atrial fibrillation   HTN (hypertension)   CAD- PCI 2005, cath 2012 OK   Non compliance w medication regimen   Liver cirrhosis   Tobacco abuse   AICD (automatic cardioverter/defibrillator) present   Chest pain   Atrial fibrillation    Time spent:25    Kela Millin  [facility] Pager [phone]. If 7PM-7AM, please contact night-coverage at [email or web address], password [facility] 05/08/2014, 9:15 AM  LOS: 3 days              Not a

## 2014-05-08 NOTE — Progress Notes (Signed)
Advanced Heart Failure Rounding Note  Primary Cardiologist: Dr. Lahoma Rocker, [facility], [place])  Subjective:    Tim Reyes is a 48 yo [place] a history of mixed ICM/NICM s/p ICD (Biotronik), chronic systolic heart failure, CAD s/p angioplasty x 1 vessel (2005), chronic atrial fibrillation, HTN, OSA and history of non-compliance.  Last cardiac catheterization Feb 2012: 20% LCx & RCA  ECHO EF 15%, diff HK, biatrial enlargement, mod/severe MR, severe TR, RV sys fx mildly dilated  Co-ox 62% initially but now 49%. Walking halls with mild dyspnea. Diuresed well. Weight down 21 pounds total but CVP still 13. Son got jumped last night and he is having CP due to stress over this.   ICD interrogated shows chronic AF with 62% RV pacing - back-up rate dropped to 40 and now he is conducting intrinsically in the 60s  Objective:   Weight Range:  Vital Signs:   Temp:  [97.6 F (36.4 C)-98.2 F (36.8 C)] 98.2 F (36.8 C) (08/23 0827) Pulse Rate:  [70-75] 75 (08/23 0827) Resp:  [16-26] 20 (08/23 0827) BP: (108-127)/(72-85) 108/78 mmHg (08/23 0827) SpO2:  [99 %-100 %] 99 % (08/23 0827) Weight:  [84.5 kg (186 lb 4.6 oz)] 84.5 kg (186 lb 4.6 oz) (08/23 0345) Last BM Date: 05/07/14  Weight change: Filed Weights   05/05/14 2055 05/07/14 0400 05/08/14 0345  Weight: 87.8 kg (193 lb 9 oz) 85.9 kg (189 lb 6 oz) 84.5 kg (186 lb 4.6 oz)    Intake/Output:   Intake/Output Summary (Last 24 hours) at 05/08/14 1113 Last data filed at 05/08/14 0900  Gross per 24 hour  Intake   1630 ml  Output   3275 ml  Net  -1645 ml    CVP 13 with prominent v-waves Physical Exam: General:  No resp difficulty, lying flat in bed HEENT: normal Neck: supple. JVP ear Carotids 2+ bilat; no bruits. No lymphadenopathy or thryomegaly appreciated. Cor: PMI nondisplaced. Regular rate & irregular rhythm. +s3, 3/6 TR and 3/6 MR Lungs: clear Abdomen: soft, nontender,  nondistended. No hepatosplenomegaly. No bruits or masses. Good bowel sounds. Extremities: no cyanosis, clubbing, rash, edema, R 2L PICC Neuro: alert & orientedx3, cranial nerves grossly intact. moves all 4 extremities w/o difficulty. Affect pleasant  Telemetry: AF 60s (no v pacing any more)  Labs: Basic Metabolic Panel:  Recent Labs Lab 05/05/14 1610 05/06/14 0320 05/06/14 1020 05/07/14 0404 05/08/14 0355  NA 140 139  --  141 139  K 3.8 3.3*  --  3.7 3.6*  CL 102 103  --  101 99  CO2 24 23  --  27 29  GLUCOSE 106* 117*  --  89 92  BUN 17 16  --  18 20  CREATININE 0.96 0.97  --  1.18 1.27  CALCIUM 9.3 8.9  --  8.9 8.9  MG  --   --  1.7  --   --     Liver Function Tests:  Recent Labs Lab 05/05/14 1610  AST 23  ALT 15  ALKPHOS 91  BILITOT 3.9*  PROT 7.2  ALBUMIN 3.6   No results found for this basename: LIPASE, AMYLASE,  in the last 168 hours No results found for this basename: AMMONIA,  in the last 168 hours  CBC:  Recent Labs Lab 05/05/14 1610 05/06/14 0320  WBC 5.4 5.8  NEUTROABS 4.6  --   HGB 10.5* 9.3*  HCT 33.1* 30.0*  MCV 71.5* 72.1*  PLT  269 248    Cardiac Enzymes:  Recent Labs Lab 05/05/14 1922 05/06/14 0002 05/06/14 0400 05/06/14 1020  TROPONINI <0.30 <0.30 <0.30 <0.30    BNP: BNP (last 3 results)  Recent Labs  04/20/14 2159 04/28/14 0811 05/05/14 1610  PROBNP 2601.0* 4523.0* 5876.0*     Other results:  EKG: afib with V paced  Imaging: No results found.   Medications:     Scheduled Medications: . atorvastatin  40 mg Oral Daily  . carvedilol  3.125 mg Oral BID WC  . digoxin  0.125 mg Oral Daily  . furosemide  80 mg Intravenous BID  . isosorbide mononitrate  30 mg Oral Daily  . lisinopril  2.5 mg Oral Daily  . pantoprazole  40 mg Oral Daily  . potassium chloride  20 mEq Oral BID  . [START ON 05/09/2014] rivaroxaban  20 mg Oral Q supper  . sertraline  50 mg Oral Daily  . sodium chloride  3 mL Intravenous Q12H  .  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 He also has AF. He has managed with intermittent outpatient administration of dobutamine in [place], [place]. He now has had several HF readmits while visiting family in [place]. No one has ever discussed advanced therapies with him. On exam has prominent s3 and volume overload.   He has diuresed well. Unfortunately co-ox remains low. Renal function slightly worse. Will hold diuretics today.  Plan L/RHC with milrinone challenge tomorrow via femoral approach. Will likely need home milrinone.Hold Xarelto on tonight.   He has chronic AF and > 60% RV pacing. I d/w Dr. Graciela Husbands. We turned pacing rate down and now conducting intrinsically. Will get ECG to look at native QRS   Long talk about possible need for work-up for advanced therapies and he would like to do this at [facility] in [place], [place].  Tim Bensimhon,MD 11:13 AM

## 2014-05-09 ENCOUNTER — Encounter (HOSPITAL_COMMUNITY)
Admission: EM | Disposition: A | Discharge status: Home Health | Payer: Self-pay | Source: Home / Self Care | Attending: Internal Medicine

## 2014-05-09 DIAGNOSIS — I2510 Atherosclerotic heart disease of native coronary artery without angina pectoris: Secondary | ICD-10-CM

## 2014-05-09 DIAGNOSIS — I509 Heart failure, unspecified: Secondary | ICD-10-CM

## 2014-05-09 DIAGNOSIS — I739 Peripheral vascular disease, unspecified: Secondary | ICD-10-CM

## 2014-05-09 HISTORY — PX: LEFT AND RIGHT HEART CATHETERIZATION WITH CORONARY ANGIOGRAM: SHX5449

## 2014-05-09 LAB — BASIC METABOLIC PANEL
Anion gap: 10 (ref 5–15)
BUN: 21 mg/dL (ref 6–23)
CO2: 28 mEq/L (ref 19–32)
CREATININE: 1.13 mg/dL (ref 0.50–1.35)
Calcium: 9 mg/dL (ref 8.4–10.5)
Chloride: 98 mEq/L (ref 96–112)
GFR calc Af Amer: 87 mL/min — ABNORMAL LOW (ref >90)
GFR calc non Af Amer: 75 mL/min — ABNORMAL LOW (ref >90)
GLUCOSE: 88 mg/dL (ref 70–99)
Potassium: 3.8 mEq/L (ref 3.7–5.3)
Sodium: 136 mEq/L — ABNORMAL LOW (ref 137–147)

## 2014-05-09 LAB — POCT I-STAT 3, ART BLOOD GAS (G3+)
Acid-Base Excess: 3 mmol/L — ABNORMAL HIGH (ref 0.0–2.0)
Bicarbonate: 27.1 mEq/L — ABNORMAL HIGH (ref 20.0–24.0)
O2 Saturation: 92 %
PCO2 ART: 37.7 mmHg (ref 35.0–45.0)
TCO2: 28 mmol/L (ref 0–100)
pH, Arterial: 7.465 — ABNORMAL HIGH (ref 7.350–7.450)
pO2, Arterial: 59 mmHg — ABNORMAL LOW (ref 80.0–100.0)

## 2014-05-09 LAB — POCT I-STAT 3, VENOUS BLOOD GAS (G3P V)
ACID-BASE EXCESS: 3 mmol/L — AB (ref 0.0–2.0)
ACID-BASE EXCESS: 4 mmol/L — AB (ref 0.0–2.0)
BICARBONATE: 28.2 meq/L — AB (ref 20.0–24.0)
Bicarbonate: 29.7 mEq/L — ABNORMAL HIGH (ref 20.0–24.0)
O2 Saturation: 46 %
O2 Saturation: 46 %
TCO2: 31 mmol/L (ref 0–100)
TCO2: 30 mmol/L (ref 0–100)
pCO2, Ven: 45.3 mmHg (ref 45.0–50.0)
pCO2, Ven: 47.3 mmHg (ref 45.0–50.0)
pH, Ven: 7.402 — ABNORMAL HIGH (ref 7.250–7.300)
pH, Ven: 7.405 — ABNORMAL HIGH (ref 7.250–7.300)
pO2, Ven: 25 mmHg — CL (ref 30.0–45.0)
pO2, Ven: 26 mmHg — CL (ref 30.0–45.0)

## 2014-05-09 LAB — CARBOXYHEMOGLOBIN
CARBOXYHEMOGLOBIN: 1.3 % (ref 0.5–1.5)
Methemoglobin: 0.7 % (ref 0.0–1.5)
O2 Saturation: 44.2 %
Total hemoglobin: 9 g/dL — ABNORMAL LOW (ref 13.5–18.0)

## 2014-05-09 SURGERY — LEFT AND RIGHT HEART CATHETERIZATION WITH CORONARY ANGIOGRAM
Anesthesia: LOCAL

## 2014-05-09 MED ORDER — SODIUM CHLORIDE 0.9 % IV SOLN: INTRAVENOUS | Status: AC

## 2014-05-09 MED ORDER — HEPARIN (PORCINE) IN NACL 2-0.9 UNIT/ML-% IJ SOLN
INTRAMUSCULAR | Status: AC
  Filled 2014-05-09: qty 1000

## 2014-05-09 MED ORDER — MIDAZOLAM HCL 2 MG/2ML IJ SOLN
INTRAMUSCULAR | Status: AC
  Filled 2014-05-09: qty 2

## 2014-05-09 MED ORDER — NITROGLYCERIN 1 MG/10 ML FOR IR/CATH LAB
INTRA_ARTERIAL | Status: AC
  Filled 2014-05-09: qty 10

## 2014-05-09 MED ORDER — RIVAROXABAN 20 MG PO TABS
20.0000 mg | ORAL_TABLET | Freq: Every day | ORAL | Status: DC
  Administered 2014-05-09 – 2014-05-10 (×2): 20 mg via ORAL
  Filled 2014-05-09 (×3): qty 1

## 2014-05-09 MED ORDER — ACETAMINOPHEN 325 MG PO TABS: 650.0000 mg | ORAL_TABLET | ORAL | Status: DC | PRN

## 2014-05-09 MED ORDER — ONDANSETRON HCL 4 MG/2ML IJ SOLN: 4.0000 mg | Freq: Four times a day (QID) | INTRAMUSCULAR | Status: DC | PRN

## 2014-05-09 MED ORDER — MILRINONE IN DEXTROSE 20 MG/100ML IV SOLN
0.2500 ug/kg/min | INTRAVENOUS | Status: DC
  Administered 2014-05-09 – 2014-05-11 (×4): 0.25 ug/kg/min via INTRAVENOUS
  Filled 2014-05-09 (×5): qty 100

## 2014-05-09 MED ORDER — LIDOCAINE HCL (PF) 1 % IJ SOLN
INTRAMUSCULAR | Status: AC
  Filled 2014-05-09: qty 30

## 2014-05-09 MED ORDER — FENTANYL CITRATE 0.05 MG/ML IJ SOLN
INTRAMUSCULAR | Status: AC
  Filled 2014-05-09: qty 2

## 2014-05-09 NOTE — Progress Notes (Signed)
Site area: rt groin Site Prior to Removal:  Level 0 Pressure Applied For: 20 minutes Manual:   yes Patient Status During Pull:  stable Post Pull Site:  Level 0 Post Pull Instructions Given:  yes Post Pull Pulses Present: yes Dressing Applied:  yes Bedrest begins @ 1035 Comments: no complications

## 2014-05-09 NOTE — Progress Notes (Signed)
Post cath fluid orders of 0.9% normal saline @ 75cc/hr for 4 hours confirmed by Dr. Gala Romney.

## 2014-05-09 NOTE — H&P (View-Only) (Signed)
Advanced Heart Failure Rounding Note  Primary Cardiologist: Dr. Lindsey Brickle, [facility]  Subjective:    Tim Reyes is a 48 yo [place] a history of mixed ICM/NICM s/p ICD (Biotronik), chronic systolic heart failure, CAD s/p angioplasty x 1 vessel (2005), chronic atrial fibrillation, HTN, OSA and history of non-compliance.  Last cardiac catheterization Feb 2012: 20% LCx & RCA  ECHO EF 15%, diff HK, biatrial enlargement, mod/severe MR, severe TR, RV sys fx mildly dilated  Co-ox 62% initially but now 49%. Walking halls with mild dyspnea. Diuresed well. Weight down 21 pounds total but CVP still 13. Son got jumped last night and he is having CP due to stress over this.   ICD interrogated shows chronic AF with 62% RV pacing - back-up rate dropped to 40 and now he is conducting intrinsically in the 60s  Objective:   Weight Range:  Vital Signs:   Temp:  [97.6 F (36.4 C)-98.2 F (36.8 C)] 98.2 F (36.8 C) (08/23 0827) Pulse Rate:  [70-75] 75 (08/23 0827) Resp:  [16-26] 20 (08/23 0827) BP: (108-127)/(72-85) 108/78 mmHg (08/23 0827) SpO2:  [99 %-100 %] 99 % (08/23 0827) Weight:  [84.5 kg (186 lb 4.6 oz)] 84.5 kg (186 lb 4.6 oz) (08/23 0345) Last BM Date: 05/07/14  Weight change: Filed Weights   05/05/14 2055 05/07/14 0400 05/08/14 0345  Weight: 87.8 kg (193 lb 9 oz) 85.9 kg (189 lb 6 oz) 84.5 kg (186 lb 4.6 oz)    Intake/Output:   Intake/Output Summary (Last 24 hours) at 05/08/14 1113 Last data filed at 05/08/14 0900  Gross per 24 hour  Intake   1630 ml  Output   3275 ml  Net  -1645 ml    CVP 13 with prominent v-waves Physical Exam: General:  No resp difficulty, lying flat in bed HEENT: normal Neck: supple. JVP ear Carotids 2+ bilat; no bruits. No lymphadenopathy or thryomegaly appreciated. Cor: PMI nondisplaced. Regular rate & irregular rhythm. +s3, 3/6 TR and 3/6 MR Lungs: clear Abdomen: soft, nontender,  nondistended. No hepatosplenomegaly. No bruits or masses. Good bowel sounds. Extremities: no cyanosis, clubbing, rash, edema, R 2L PICC Neuro: alert & orientedx3, cranial nerves grossly intact. moves all 4 extremities w/o difficulty. Affect pleasant  Telemetry: AF 60s (no v pacing any more)  Labs: Basic Metabolic Panel:  Recent Labs Lab 05/05/14 1610 05/06/14 0320 05/06/14 1020 05/07/14 0404 05/08/14 0355  NA 140 139  --  141 139  K 3.8 3.3*  --  3.7 3.6*  CL 102 103  --  101 99  CO2 24 23  --  27 29  GLUCOSE 106* 117*  --  89 92  BUN 17 16  --  18 20  CREATININE 0.96 0.97  --  1.18 1.27  CALCIUM 9.3 8.9  --  8.9 8.9  MG  --   --  1.7  --   --     Liver Function Tests:  Recent Labs Lab 05/05/14 1610  AST 23  ALT 15  ALKPHOS 91  BILITOT 3.9*  PROT 7.2  ALBUMIN 3.6   No results found for this basename: LIPASE, AMYLASE,  in the last 168 hours No results found for this basename: AMMONIA,  in the last 168 hours  CBC:  Recent Labs Lab 05/05/14 1610 05/06/14 0320  WBC 5.4 5.8  NEUTROABS 4.6  --   HGB 10.5* 9.3*  HCT 33.1* 30.0*  MCV 71.5* 72.1*  PLT   269 248    Cardiac Enzymes:  Recent Labs Lab 05/05/14 1922 05/06/14 0002 05/06/14 0400 05/06/14 1020  TROPONINI <0.30 <0.30 <0.30 <0.30    BNP: BNP (last 3 results)  Recent Labs  04/20/14 2159 04/28/14 0811 05/05/14 1610  PROBNP 2601.0* 4523.0* 5876.0*     Other results:  EKG: afib with V paced  Imaging: No results found.   Medications:     Scheduled Medications: . atorvastatin  40 mg Oral Daily  . carvedilol  3.125 mg Oral BID WC  . digoxin  0.125 mg Oral Daily  . furosemide  80 mg Intravenous BID  . isosorbide mononitrate  30 mg Oral Daily  . lisinopril  2.5 mg Oral Daily  . pantoprazole  40 mg Oral Daily  . potassium chloride  20 mEq Oral BID  . [START ON 05/09/2014] rivaroxaban  20 mg Oral Q supper  . sertraline  50 mg Oral Daily  . sodium chloride  3 mL Intravenous Q12H  .  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 He also has AF. He has managed with intermittent outpatient administration of dobutamine in [place], [place]. He now has had several HF readmits while visiting family in [place]. No one has ever discussed advanced therapies with him. On exam has prominent s3 and volume overload.   He has diuresed well. Unfortunately co-ox remains low. Renal function slightly worse. Will hold diuretics today.  Plan L/RHC with milrinone challenge tomorrow via femoral approach. Will likely need home milrinone.Hold Xarelto on tonight.   He has chronic AF and > 60% RV pacing. I d/w Dr. Graciela Husbands. We turned pacing rate down and now conducting intrinsically. Will get ECG to look at native QRS   Long talk about possible need for work-up for advanced therapies and he would like to do this at [facility] in [place], [place].  Daniel Bensimhon,MD 11:13 AM

## 2014-05-09 NOTE — CV Procedure (Signed)
Cardiac Cath Procedure Note  Indication: Heart failure and chest pain  Procedures performed:  1) Right heart cathererization 2) Selective coronary angiography 3) Left heart catheterization 4) Left ventriculogram 5) Abdominal aortogram 6) Selective L renal angiography  Description of procedure:     The risks and indication of the procedure were explained. Consent was signed and placed on the chart. An appropriate timeout was taken prior to the procedure. The right groin was prepped and draped in the routine sterile fashion and anesthetized with 1% local lidocaine.   A 5 FR arterial sheath was placed in the right femoral artery using a modified Seldinger technique. Standard catheters including a JL4, JR4 and angled pigtail were used. All catheter exchanges were made over a wire. A 7 FR venous sheath was placed in the right femoral vein using a modified Seldinger technique. A standard Swan-Ganz catheter was used for the procedure.   Complications:  None apparent  Findings:  RA = 17 RV =  55/6/15 PA =  54/27 (38) PCW = 20 Fick cardiac output/index = 4.2/2.0 PVR = 4.3 WU FA sat = 98% PA sat = 46%, 46%  Ao Pressure: 104/76 (88) LV Pressure:  104/19/25 There was no signficant gradient across the aortic valve on pullback.  Left main: Normal   LAD:  Gives off large D1. Small D2. Wraps apex. Normal  LCX: Large vessel. Gives of 3 OMs. Diffuse 50% through mid AV groove LCX  RCA: Dominant vessel. Diffuse 30-40% through proximal and mid vessel  LV-gram done in the RAO projection: Done by hand injection. Markedly dilated and globally hypokinetic. Poorly opacified. EF 10-15%  AB aortogram: Marked tortuosity. With very slow flow due to HF. No significant stenosis. R renal normal. L renal not well visualized  L renal artery; posterior take-off. No stenosis  Assessment: 1. Mild to moderate non-obstructive CAD 2. Severe LV dysfunction due to NICM. EF ~ 10-15% 3. Mild to moderately  elevated biventricular pressures with low cardiac output  Plan/Discussion:  He has severe NICM with low cardiac output. With mixed venous sat 46% suspect Fick may be underestimated. He will need milrinone.   Braydon Kullman,MD 10:03 AM

## 2014-05-09 NOTE — Progress Notes (Signed)
[facility] PROGRESS NOTE  Vedh Brito INO:[id number] DOB: [birth year] DOA: 05/05/2014 PCP: No primary provider on file.  Assessment/Plan: 1. Acute on chronic systolic CHF-Last EF 15%,  -continuing to diurese with IV Lasix overnight, 1.3L neg>> follow -Continue dig, Coreg, and nitrates -Cardiac enzymes negative X3 -s/p cardiac cath per Dr Gala Romney this am and now started on milrinone drip -discussed pt with Dr Gala Romney and he states he will take over pt's care>> will transfer to his service. 2. Chest pain -Troponins negative x3 -Continue Coreg, nitrates, statin -Cardiology to follow further recommendations 3. Atrial fibrillation -Rate controlled, continue xarelto 4. Tobacco abuse -Counseled to quit on admission 5.Chronic anemia -Hemoglobin stable, no gross bleeding>> follow 6. History of cirrhosis-states he quit drinking 7. Hypokalemia -Secondary to diuretics, resolved. Code Status: Full Family Communication: None at bedside Disposition Plan: Transfer Dr. Prescott Gum service, please call back as needed.   Consultants:  Cardiology  Procedures:  None  Antibiotics:  None  HPI/Subjective: denies any new complaints. Objective: Filed Vitals:   05/09/14 1300  BP: 119/82  Pulse: 88  Temp:   Resp:     Intake/Output Summary (Last 24 hours) at 05/09/14 1512 Last data filed at 05/09/14 1300  Gross per 24 hour  Intake  567.6 ml  Output   1550 ml  Net -982.4 ml   Filed Weights   05/07/14 0400 05/08/14 0345 05/09/14 0333  Weight: 85.9 kg (189 lb 6 oz) 84.5 kg (186 lb 4.6 oz) 84.188 kg (185 lb 9.6 oz)   Exam:  General: alert & oriented x 3 In NAD Cardiovascular: Irregular irregular, rate controlled, nl S1 s2 Respiratory: Decreased breath sounds at bases Abdomen: soft +BS NT/ND, no masses palpable Extremities: No cyanosis and no edema      Data Reviewed: Basic Metabolic Panel:  Recent Labs Lab 05/05/14 1610 05/06/14 0320 05/06/14 1020  05/07/14 0404 05/08/14 0355 05/09/14 0330  NA 140 139  --  141 139 136*  K 3.8 3.3*  --  3.7 3.6* 3.8  CL 102 103  --  101 99 98  CO2 24 23  --  27 29 28   GLUCOSE 106* 117*  --  89 92 88  BUN 17 16  --  18 20 21   CREATININE 0.96 0.97  --  1.18 1.27 1.13  CALCIUM 9.3 8.9  --  8.9 8.9 9.0  MG  --   --  1.7  --   --   --    Liver Function Tests:  Recent Labs Lab 05/05/14 1610  AST 23  ALT 15  ALKPHOS 91  BILITOT 3.9*  PROT 7.2  ALBUMIN 3.6   No results found for this basename: LIPASE, AMYLASE,  in the last 168 hours No results found for this basename: AMMONIA,  in the last 168 hours CBC:  Recent Labs Lab 05/05/14 1610 05/06/14 0320  WBC 5.4 5.8  NEUTROABS 4.6  --   HGB 10.5* 9.3*  HCT 33.1* 30.0*  MCV 71.5* 72.1*  PLT 269 248   Cardiac Enzymes:  Recent Labs Lab 05/05/14 1922 05/06/14 0002 05/06/14 0400 05/06/14 1020 05/08/14 1830  TROPONINI <0.30 <0.30 <0.30 <0.30 <0.30   BNP (last 3 results)  Recent Labs  04/20/14 2159 04/28/14 0811 05/05/14 1610  PROBNP 2601.0* 4523.0* 5876.0*   CBG: No results found for this basename: GLUCAP,  in the last 168 hours  Recent Results (from the past 240 hour(s))  MRSA PCR SCREENING     Status: None   Collection Time  05/05/14  8:59 PM      Result Value Ref Range Status   MRSA by PCR NEGATIVE  NEGATIVE Final   Comment:            The GeneXpert MRSA Assay (FDA     approved for NASAL specimens     only), is one component of a     comprehensive MRSA colonization     surveillance program. It is not     intended to diagnose MRSA     infection nor to guide or     monitor treatment for     MRSA infections.     Studies: No results found.  Scheduled Meds: . atorvastatin  40 mg Oral Daily  . carvedilol  3.125 mg Oral BID WC  . digoxin  0.125 mg Oral Daily  . isosorbide mononitrate  30 mg Oral Daily  . lisinopril  2.5 mg Oral Daily  . pantoprazole  40 mg Oral Daily  . potassium chloride  20 mEq Oral BID   . rivaroxaban  20 mg Oral Q supper  . sertraline  50 mg Oral Daily  . sodium chloride  3 mL Intravenous Q12H  . spironolactone  12.5 mg Oral Daily   Continuous Infusions: . milrinone 0.25 mcg/kg/min (05/09/14 1122)    Principal Problem:   Chest pain with moderate risk of acute coronary syndrome Active Problems:   Chronic systolic congestive heart failure, NYHA class 4   Chronic atrial fibrillation   HTN (hypertension)   CAD- PCI 2005, cath 2012 OK   Non compliance w medication regimen   Liver cirrhosis   Tobacco abuse   AICD (automatic cardioverter/defibrillator) present   Chest pain   Atrial fibrillation    Time spent:25    Kela Millin  [facility] Pager [phone]. If 7PM-7AM, please contact night-coverage at [email or web address], password [facility] 05/09/2014, 3:12 PM  LOS: 4 days              Not a

## 2014-05-09 NOTE — Interval H&P Note (Signed)
History and Physical Interval Note:  05/09/2014 9:12 AM  Tim Reyes  has presented today for surgery, with the diagnosis of heart failure  The various methods of treatment have been discussed with the patient and family. After consideration of risks, benefits and other options for treatment, the patient has consented to  Procedure(s): LEFT AND RIGHT HEART CATHETERIZATION WITH CORONARY ANGIOGRAM (N/A) and possible angioplasty as a surgical intervention .  The patient's history has been reviewed, patient examined, no change in status, stable for surgery.  I have reviewed the patient's chart and labs.  Questions were answered to the patient's satisfaction.    Cath Lab Visit (complete for each Cath Lab visit)  Clinical Evaluation Leading to the Procedure:   ACS: No.  Non-ACS:    Anginal Classification: CCS III  Anti-ischemic medical therapy: Minimal Therapy (1 class of medications)  Non-Invasive Test Results: No non-invasive testing performed  Prior CABG: No previous CABG       Arvilla Meres

## 2014-05-10 LAB — BASIC METABOLIC PANEL
Anion gap: 11 (ref 5–15)
BUN: 16 mg/dL (ref 6–23)
CHLORIDE: 99 meq/L (ref 96–112)
CO2: 27 mEq/L (ref 19–32)
CREATININE: 1.07 mg/dL (ref 0.50–1.35)
Calcium: 8.8 mg/dL (ref 8.4–10.5)
GFR calc Af Amer: >90 mL/min (ref >90)
GFR calc non Af Amer: 80 mL/min — ABNORMAL LOW (ref >90)
GLUCOSE: 120 mg/dL — AB (ref 70–99)
POTASSIUM: 3.8 meq/L (ref 3.7–5.3)
Sodium: 137 mEq/L (ref 137–147)

## 2014-05-10 LAB — CARBOXYHEMOGLOBIN
Carboxyhemoglobin: 1.8 % — ABNORMAL HIGH (ref 0.5–1.5)
Methemoglobin: 0.5 % (ref 0.0–1.5)
O2 SAT: 67.7 %
Total hemoglobin: 8.9 g/dL — ABNORMAL LOW (ref 13.5–18.0)

## 2014-05-10 MED ORDER — SODIUM CHLORIDE 0.9 % IV SOLN: INTRAVENOUS | Status: DC

## 2014-05-10 MED ORDER — ISOSORB DINITRATE-HYDRALAZINE 20-37.5 MG PO TABS
1.0000 | ORAL_TABLET | Freq: Three times a day (TID) | ORAL | Status: DC
  Administered 2014-05-10 – 2014-05-11 (×4): 1 via ORAL
  Filled 2014-05-10 (×6): qty 1

## 2014-05-10 MED ORDER — FUROSEMIDE 10 MG/ML IJ SOLN
80.0000 mg | Freq: Two times a day (BID) | INTRAMUSCULAR | Status: DC
  Administered 2014-05-10 – 2014-05-11 (×3): 80 mg via INTRAVENOUS
  Filled 2014-05-10 (×6): qty 8

## 2014-05-10 NOTE — Progress Notes (Signed)
Advanced Heart Failure Rounding Note  Primary Cardiologist: Dr. Lahoma Rocker, [facility], [place])  Subjective:    Tim [place] is a 48 yo [place] a history of mixed ICM/NICM s/p ICD (Biotronik), chronic systolic heart failure, CAD s/p angioplasty x 1 vessel (2005), chronic atrial fibrillation, HTN, OSA and history of non-compliance.  Last cardiac catheterization Feb 2012: 20% LCx & RCA  ECHO EF 15%, diff HK, biatrial enlargement, mod/severe MR, severe TR, RV sys fx mildly dilated ICD interrogated shows chronic AF with 62% RV pacing - back-up rate dropped to 40 and now he is conducting intrinsically in the 60s  Yesterday he had RHC/LHC as noted below. Mixed venous sat 46% and he was started on Milrinone at 0.25 mcg.  Weight up 4 pounds. Denies SOB.    RHC/LHC  RA = 17  RV = 55/6/15  PA = 54/27 (38)  PCW = 20  Fick cardiac output/index = 4.2/2.0  PVR = 4.3 WU  FA sat = 98%  PA sat = 46%, 46% LCX: Large vessel. Gives of 3 OMs. Diffuse 50% through mid AV groove LCX  RCA: Dominant vessel. Diffuse 30-40% through proximal and mid vessel   CO-OX 67% on Milrinone 0.25 mcg.   Objective:   Weight Range:  Vital Signs:   Temp:  [97.4 F (36.3 C)-98.3 F (36.8 C)] 98.1 F (36.7 C) (08/25 0505) Pulse Rate:  [28-187] 65 (08/24 2039) Resp:  [10-23] 16 (08/25 0017) BP: (101-145)/(59-96) 127/90 mmHg (08/25 0017) SpO2:  [97 %-100 %] 99 % (08/25 0505) Weight:  [189 lb 3.2 oz (85.821 kg)] 189 lb 3.2 oz (85.821 kg) (08/25 0505) Last BM Date: 05/08/14  Weight change: Filed Weights   05/08/14 0345 05/09/14 0333 05/10/14 0505  Weight: 186 lb 4.6 oz (84.5 kg) 185 lb 9.6 oz (84.188 kg) 189 lb 3.2 oz (85.821 kg)    Intake/Output:   Intake/Output Summary (Last 24 hours) at 05/10/14 0711 Last data filed at 05/10/14 0017  Gross per 24 hour  Intake    800 ml  Output    802 ml  Net     -2 ml    CVP 22 with prominent v-waves Physical  Exam: General:  No resp difficulty, lying in bed HEENT: normal Neck: supple. JVP ear Carotids 2+ bilat; no bruits. No lymphadenopathy or thryomegaly appreciated. Cor: PMI nondisplaced. Regular rate & irregular rhythm. +s3, 3/6 TR and 3/6 MR Lungs: clear Abdomen: soft, nontender, ++distended. No hepatosplenomegaly. No bruits or masses. Good bowel sounds. Extremities: no cyanosis, clubbing, rash, edema, RUE 2L PICC Neuro: alert & orientedx3, cranial nerves grossly intact. moves all 4 extremities w/o difficulty. Affect pleasant  Telemetry: AF 60s (no v pacing any more)  Labs: Basic Metabolic Panel:  Recent Labs Lab 05/06/14 0320 05/06/14 1020 05/07/14 0404 05/08/14 0355 05/09/14 0330 05/10/14 0500  NA 139  --  141 139 136* 137  K 3.3*  --  3.7 3.6* 3.8 3.8  CL 103  --  101 99 98 99  CO2 23  --  GLUCOSE 117*  --  89 92 88 120*  BUN 16  --  CREATININE 0.97  --  1.18 1.27 1.13 1.07  CALCIUM 8.9  --  8.9 8.9 9.0 8.8  MG  --  1.7  --   --   --   --     Liver Function Tests:  Recent Labs Lab 05/05/14  1610  AST 23  ALT 15  ALKPHOS 91  BILITOT 3.9*  PROT 7.2  ALBUMIN 3.6   No results found for this basename: LIPASE, AMYLASE,  in the last 168 hours No results found for this basename: AMMONIA,  in the last 168 hours  CBC:  Recent Labs Lab 05/05/14 1610 05/06/14 0320  WBC 5.4 5.8  NEUTROABS 4.6  --   HGB 10.5* 9.3*  HCT 33.1* 30.0*  MCV 71.5* 72.1*  PLT 269 248    Cardiac Enzymes:  Recent Labs Lab 05/05/14 1922 05/06/14 0002 05/06/14 0400 05/06/14 1020 05/08/14 1830  TROPONINI <0.30 <0.30 <0.30 <0.30 <0.30    BNP: BNP (last 3 results)  Recent Labs  04/20/14 2159 04/28/14 0811 05/05/14 1610  PROBNP 2601.0* 4523.0* 5876.0*     Other results:  EKG: afib with V paced  Imaging: No results found.   Medications:     Scheduled Medications: . atorvastatin  40 mg Oral Daily  . carvedilol  3.125 mg Oral BID WC  .  digoxin  0.125 mg Oral Daily  . isosorbide mononitrate  30 mg Oral Daily  . lisinopril  2.5 mg Oral Daily  . pantoprazole  40 mg Oral Daily  . potassium chloride  20 mEq Oral BID  . rivaroxaban  20 mg Oral Q supper  . sertraline  50 mg Oral Daily  . sodium chloride  3 mL Intravenous Q12H  . spironolactone  12.5 mg Oral Daily    Infusions: . milrinone 0.25 mcg/kg/min (05/10/14 0017)    PRN Medications: acetaminophen, acetaminophen, acetaminophen, albuterol, morphine injection, nitroGLYCERIN, ondansetron (ZOFRAN) IV, ondansetron   Assessment:   1) A/C systolic HF -EF 15% 2) NICM/ICM - s/p ICD Biotronik  3) Hypokalemia 4) HTN 5) Frequent PVCs 6) CAD - s/p angioplasty x 1 vessel 2005 7) Atrial fibrillation 8) Mod/severe MR 9) Severe TR 10) Chest pain    Plan/Discussion:    He has progressive, advanced HF with NYHA III-IIIB due primarily to a NICM CM with EF 15%. He also has AF. He has managed with intermittent outpatient administration of dobutamine in [place], [place]. He now has had several HF readmits while visiting family in [place].   CO-OX improved on Milrionone 0.25 mcg. Volume status elevated. CVP 22. Start lasix 80 mg IV BID. Renal function stable. Continue low dose carvedilol. Add Bidil 20-37.5 mg tid. Continue lisinopril 2.5 mg daily. Continue 12.5 mg spironolactone. Consult cardiac rehab.   He has chronic AF and > 60% RV pacing. On Xarelto 20 mg daily.   He is not sure he can afford home Milrinone.   CLEGG,AMY, NP-C  7:11 AM  Patient seen and examined with Tonye Becket, NP. We discussed all aspects of the encounter. I agree with the assessment and plan as stated above.   RHC results reviewed with him and reflective of low output. Output improved on milrinone but volume status remains elevated. Will continue diuresis.   V-pacing now less after lowering back-up rate on ICD.   I have left a message with his primary cardiologist Dr. Lahoma Rocker (pager [phone]) in  [place] to discuss options. I am not sure that he would be a good candidate for advanced therapies outside of home inotropes.   Tim Seubert,MD 10:00 AM

## 2014-05-11 LAB — BASIC METABOLIC PANEL
Anion gap: 11 (ref 5–15)
BUN: 15 mg/dL (ref 6–23)
CO2: 28 mEq/L (ref 19–32)
Calcium: 8.7 mg/dL (ref 8.4–10.5)
Chloride: 103 mEq/L (ref 96–112)
Creatinine, Ser: 1.02 mg/dL (ref 0.50–1.35)
GFR calc Af Amer: >90 mL/min (ref >90)
GFR, EST NON AFRICAN AMERICAN: 85 mL/min — AB (ref >90)
GLUCOSE: 93 mg/dL (ref 70–99)
POTASSIUM: 3.5 meq/L — AB (ref 3.7–5.3)
Sodium: 142 mEq/L (ref 137–147)

## 2014-05-11 LAB — CARBOXYHEMOGLOBIN
Carboxyhemoglobin: 1.4 % (ref 0.5–1.5)
Methemoglobin: 0.8 % (ref 0.0–1.5)
O2 SAT: 66.7 %
Total hemoglobin: 9.2 g/dL — ABNORMAL LOW (ref 13.5–18.0)

## 2014-05-11 MED ORDER — CARVEDILOL 3.125 MG PO TABS: 3.1250 mg | ORAL_TABLET | Freq: Two times a day (BID) | ORAL | Status: DC

## 2014-05-11 MED ORDER — POTASSIUM CHLORIDE CRYS ER 20 MEQ PO TBCR
20.0000 meq | EXTENDED_RELEASE_TABLET | Freq: Once | ORAL | Status: AC
  Administered 2014-05-11: 20 meq via ORAL
  Filled 2014-05-11: qty 1

## 2014-05-11 MED ORDER — ISOSORB DINITRATE-HYDRALAZINE 20-37.5 MG PO TABS: 1.0000 | ORAL_TABLET | Freq: Three times a day (TID) | ORAL | Status: DC

## 2014-05-11 MED ORDER — POTASSIUM CHLORIDE CRYS ER 20 MEQ PO TBCR: 40.0000 meq | EXTENDED_RELEASE_TABLET | Freq: Once | ORAL | Status: DC

## 2014-05-11 MED ORDER — FUROSEMIDE 40 MG PO TABS: 40.0000 mg | ORAL_TABLET | Freq: Two times a day (BID) | ORAL | Status: DC

## 2014-05-11 MED ORDER — POTASSIUM CHLORIDE ER 10 MEQ PO TBCR: 20.0000 meq | EXTENDED_RELEASE_TABLET | Freq: Two times a day (BID) | ORAL | Status: DC

## 2014-05-11 MED ORDER — SPIRONOLACTONE 25 MG PO TABS: 25.0000 mg | ORAL_TABLET | Freq: Every day | ORAL | Status: DC

## 2014-05-11 MED ORDER — SPIRONOLACTONE 25 MG PO TABS
25.0000 mg | ORAL_TABLET | Freq: Every day | ORAL | Status: DC
  Administered 2014-05-11: 25 mg via ORAL
  Filled 2014-05-11: qty 1

## 2014-05-11 MED ORDER — LISINOPRIL 2.5 MG PO TABS: 2.5000 mg | ORAL_TABLET | Freq: Every day | ORAL | Status: DC

## 2014-05-11 MED ORDER — MILRINONE IN DEXTROSE 20 MG/100ML IV SOLN: 0.2500 ug/kg/min | INTRAVENOUS | Status: DC

## 2014-05-11 NOTE — Discharge Instructions (Signed)
Myocardial Infarction  A myocardial infarction (MI) is also called a heart attack. It causes damage to the heart that cannot be fixed. An MI often happens when a blood clot or other blockage cuts blood flow to the heart. When this happens, certain areas of the heart begin to die. This is an emergency.  HOME CARE  · Take medicine as told by your doctor.  · Change certain behaviors as told by your doctor. This may include:  ¨ Quitting smoking.  ¨ Being active.  ¨ Keeping a healthy weight.  ¨ Eating a heart-healthy diet. Ask your doctor for help with this diet.  ¨ Keeping your diabetes under control.  ¨ Lessening stress.  ¨ Limiting how much alcohol you drink.  GET HELP RIGHT AWAY IF:  · You have crushing or pressure-like chest pain that spreads to the arms, back, neck, or jaw. Call your local emergency services (911 in [place] Do not drive yourself to the hospital.  · You have severe chest pain.  · You have shortness of breath during rest, sleep, or with activity.  · You have sudden sweating or clammy skin.  · You feel sick to your stomach (nauseous) and throw up (vomit).  · You suddenly get lightheaded or dizzy.  · You feel your heart beating fast or skipping beats.  MAKE SURE YOU:   · Understand these instructions.  · Will watch your condition.  · Will get help right away if you are not doing well or get worse.  Document Released: 03/03/2012 Document Reviewed: 11/05/2013  ExitCare® Patient Information ©2015 [employer]. This information is not intended to replace advice given to you by your health care provider. Make sure you discuss any questions you have with your health care provider.

## 2014-05-11 NOTE — Progress Notes (Addendum)
Reviewed low sodium, daily wts, ambulation with pt. Gave extra materials on low sodium. Pt sts he has HF booklet. Pt able to recall information in teach back format but will probably need continued reiteration. Pt sts he did not smoke the last week he was home and will continue without smoking. Gave reinforcement. [id number] Tim Reyes CES, ACSM 1:23 PM 05/11/2014

## 2014-05-11 NOTE — Progress Notes (Addendum)
Advanced Heart Failure Rounding Note  Primary Cardiologist: Dr. Lahoma Rocker, [facility], [place])  Subjective:    [place] 48 [place] a [place] of mixed ICM/NICM s/p ICD (Biotronik), chronic systolic heart failure, CAD s/p angioplasty x 1 vessel (2005), chronic atrial fibrillation, HTN, OSA and history of non-compliance.  Last cardiac catheterization Feb 2012: 20% LCx & RCA  ECHO EF 15%, diff HK, biatrial enlargement, mod/severe MR, severe TR, RV sys fx mildly dilated ICD interrogated shows chronic AF with 62% RV pacing - back-up rate dropped to 40 and now he is conducting intrinsically in the 60s  He had RHC/LHC as noted below. Mixed venous sat 46% and he was started on Milrinone at 0.25 mcg. Yesterday Bidil added and he diuresed with IV lasix.  Weight down 10 pounds.   Denies SOB   RHC/LHC  RA = 17  RV = 55/6/15  PA = 54/27 (38)  PCW = 20  Fick cardiac output/index = 4.2/2.0  PVR = 4.3 WU  FA sat = 98%  PA sat = 46%, 46% LCX: Large vessel. Gives of 3 OMs. Diffuse 50% through mid AV groove LCX  RCA: Dominant vessel. Diffuse 30-40% through proximal and mid vessel   CO-OX 66% on Milrinone 0.25 mcg.   Objective:   Weight Range:  Vital Signs:   Temp:  [97.5 F (36.4 C)-98.7 F (37.1 C)] 98.3 F (36.8 C) (08/26 0315) Pulse Rate:  [59-77] 72 (08/26 0315) Resp:  [14-20] 16 (08/26 0315) BP: (98-132)/(57-93) 127/71 mmHg (08/26 0315) SpO2:  [98 %-100 %] 98 % (08/26 0315) Weight:  [179 lb 10.8 oz (81.5 kg)] 179 lb 10.8 oz (81.5 kg) (08/26 0500) Last BM Date: 05/10/14  Weight change: Filed Weights   05/09/14 0333 05/10/14 0505 05/11/14 0500  Weight: 185 lb 9.6 oz (84.188 kg) 189 lb 3.2 oz (85.821 kg) 179 lb 10.8 oz (81.5 kg)    Intake/Output:   Intake/Output Summary (Last 24 hours) at 05/11/14 0734 Last data filed at 05/11/14 0700  Gross per 24 hour  Intake 1919.9 ml  Output   5500 ml  Net -3580.1 ml     CVP 6  Physical Exam: General:  No resp difficulty, lying in bed HEENT: normal Neck: supple. JVP 5-6  Carotids 2+ bilat; no bruits. No lymphadenopathy or thryomegaly appreciated. Cor: PMI nondisplaced. Irregular rate & irregular rhythm. +s3, 3/6 TR and 3/6 MR Lungs: clear Abdomen: soft, nontender, distended. No hepatosplenomegaly. No bruits or masses. Good bowel sounds. Extremities: no cyanosis, clubbing, rash, edema, RUE 2L PICC Neuro: alert & orientedx3, cranial nerves grossly intact. moves all 4 extremities w/o difficulty. Affect pleasant  Telemetry: AF 60s (no v pacing any more)  Labs: Basic Metabolic Panel:  Recent Labs Lab 05/06/14 0320 05/06/14 1020 05/07/14 0404 05/08/14 0355 05/09/14 0330 05/10/14 0500 05/11/14 0500  NA 139  --  141 139 136* 137 142  K 3.3*  --  3.7 3.6* 3.8 3.8 3.5*  CL 103  --  101 99 98 99 103  CO2 23  --  27 29 28 27 28   GLUCOSE 117*  --  89 92 88 120* 93  BUN 16  --  18 20 21 16 15   CREATININE 0.97  --  1.18 1.27 1.13 1.07 1.02  CALCIUM 8.9  --  8.9 8.9 9.0 8.8 8.7  MG  --  1.7  --   --   --   --   --  Liver Function Tests:  Recent Labs Lab 05/05/14 1610  AST 23  ALT 15  ALKPHOS 91  BILITOT 3.9*  PROT 7.2  ALBUMIN 3.6   No results found for this basename: LIPASE, AMYLASE,  in the last 168 hours No results found for this basename: AMMONIA,  in the last 168 hours  CBC:  Recent Labs Lab 05/05/14 1610 05/06/14 0320  WBC 5.4 5.8  NEUTROABS 4.6  --   HGB 10.5* 9.3*  HCT 33.1* 30.0*  MCV 71.5* 72.1*  PLT 269 248    Cardiac Enzymes:  Recent Labs Lab 05/05/14 1922 05/06/14 0002 05/06/14 0400 05/06/14 1020 05/08/14 1830  TROPONINI <0.30 <0.30 <0.30 <0.30 <0.30    BNP: BNP (last 3 results)  Recent Labs  04/20/14 2159 04/28/14 0811 05/05/14 1610  PROBNP 2601.0* 4523.0* 5876.0*     Other results:  EKG: afib with V paced  Imaging: No results found.   Medications:     Scheduled Medications: .  atorvastatin  40 mg Oral Daily  . carvedilol  3.125 mg Oral BID WC  . digoxin  0.125 mg Oral Daily  . furosemide  80 mg Intravenous BID  . isosorbide-hydrALAZINE  1 tablet Oral TID  . lisinopril  2.5 mg Oral Daily  . pantoprazole  40 mg Oral Daily  . potassium chloride  20 mEq Oral BID  . rivaroxaban  20 mg Oral Q supper  . sertraline  50 mg Oral Daily  . sodium chloride  3 mL Intravenous Q12H  . spironolactone  12.5 mg Oral Daily    Infusions: . sodium chloride 4 mL/hr at 05/10/14 1900  . milrinone 0.25 mcg/kg/min (05/10/14 2000)    PRN Medications: acetaminophen, acetaminophen, acetaminophen, albuterol, morphine injection, nitroGLYCERIN, ondansetron (ZOFRAN) IV, ondansetron   Assessment:   1) A/C systolic HF - Cardiogenic shock -EF 15% 2) NICM/ICM - s/p ICD Biotronik  3) Hypokalemia 4) HTN 5) Frequent PVCs 6) CAD - s/p angioplasty x 1 vessel 2005 7) Atrial fibrillation 8) Mod/severe MR 9) Severe TR 10) Chest pain    Plan/Discussion:   Dr. Lahoma Rocker (pager [phone]) in [place]   He has progressive, advanced HF with NYHA III-IIIB due primarily to a NICM CM with EF 15%. He also has AF. He has managed with intermittent outpatient administration of dobutamine in [place], [place]. He now has had several HF readmits while visiting family in [place].   Much improved on Milrinone. CO-OX 66%. Continue Milrinone 0.25 mcg. Appears euvolemic. Stop IV lasix and transition to po lasix 40 mg twice a day. Weight down 10 pounds. Renal function stable. Continue low dose carvedilol. Continue Bidil 20-37.5 mg tid. Continue lisinopril 2.5 mg daily. Increase spironolactone to 25 mg daily.   He has chronic AF and > 60% RV pacing. On Xarelto 20 mg daily.   [facility] following for milrinone.   CLEGG,AMY, NP-C  7:34 AM  Patient seen and examined with Tonye Becket, NP. We discussed all aspects of the encounter. I agree with the assessment and plan as stated above.   He is much improved on IV  milrinone. Have arranged for home therapy. CVP now 6. We have turned down the back-up rate on his pacer and now no longer RV pacing. 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 Will plan to see him Monday here in clinic prior to him going back home to [place] on 8/1. I discussed the case with his priamry cardiologist Dr. Adonis Brook in [place]. He is pending referral to [facility]  for evaluation for advanced therapies.   Yao [place],MD 8:26 AM

## 2014-05-11 NOTE — Discharge Summary (Signed)
Advanced Heart Failure Team  Discharge Summary   Patient ID: Tim Reyes MRN: [id number], DOB/AGE: Jan 31, 1966 48 y.o. Admit date: 05/05/2014 D/C date:     05/11/2014   Primary Discharge Diagnoses:  1) A/C systolic HF  -EF 40%  2) NICM/ICM  - s/p ICD Biotronik  3) Hypokalemia  4) HTN  5) Frequent PVCs  6) CAD  - s/p angioplasty x 1 vessel 2005  7) Atrial fibrillation  8) Mod/severe MR  9) Severe TR  10) Chest pain    Hospital Course:  Mr. Drees is a 48 yo [place] a history of mixed ICM/NICM s/p ICD (Biotronik), chronic systolic heart failure, CAD s/p angioplasty x 1 vessel (2005), chronic atrial fibrillation, HTN, OSA and history of non-compliance.  His last cath was Feb 2012: 20% LCx & RCA. He has had a PICC in place for  3 times a week dobutamine infusions. He has been admitted  3 times in the past month and he was evaluated 4 times in the ED for chest pain.   He presented to [facility] ED with chest pain and increased shortness of breath.  He had significant volume overload as well as prominent S3. An ECHO was performed and showed EF 15%, diff HK, biatrial enlargement, mod/severe MR, severe TR, and RV sys fx mildly dilated.    He has progressive advanced heart failure and needs an advanced therapy work up and after lengthy discussions he will pursue this a ECU/Viadant in [place]. He was diuresed with IV lasix and once optimized he had a RHC/LHC as noted below with low mixed venous saturation 46%, mild to moderately elevated biventricular filling pressures, and mild to moderate non-obstructive CAD. He was started on Milrinone at 0.25 mcg and continued to diurese with IV lasix. Mixed venous saturation improved to 67% on Milrinone and he was discharged on Milrinone at 0.25 mcg.  As his volume status improved he transitioned to po lasix 40 mg twice a day. Overall he diuresed 28 pounds. He will continue on low dose carvedilol, Bidil, lisinopril and spironolactone.    Biortornik interrogated and showed chronic AF with 60% RV pacing - back-up rate dropped to 40 and now he is conducting intrinsically in the 60s. He remained on xarelto 20 mg daily.   He will follow up in HF clinic next week and then he will be followed by his primary cardiologist Dr Lahoma Rocker September 19th in [place], [place] (Work [phone] Fax [phone]). [facility] will  follow for home milrinone and weekly BMET.   ECHO EF 15%, diff HK, biatrial enlargement, mod/severe MR, severe TR, RV sys fx mildly dilated   05/09/14 RHC/LHC  RA = 17  RV = 55/6/15  PA = 54/27 (38)  PCW = 20  Fick cardiac output/index = 4.2/2.0  PVR = 4.3 WU  FA sat = 98%  PA sat = 46%, 46%  LCX: Large vessel. Gives of 3 OMs. Diffuse 50% through mid AV groove LCX  RCA: Dominant vessel. Diffuse 30-40% through proximal and mid vessel        Discharge Weight Range: 179 pounds  Discharge Vitals: Blood pressure 122/75, pulse 72, temperature 98 F (36.7 C), temperature source Oral, resp. rate 16, height  (1.803 m), weight 179 lb 10.8 oz (81.5 kg), SpO2 100.00%.  Labs: Lab Results  Component Value Date   WBC 5.8 05/06/2014   HGB 9.3* 05/06/2014   HCT 30.0* 05/06/2014   MCV 72.1* 05/06/2014  PLT 248 05/06/2014    Recent Labs Lab 05/05/14 1610  05/11/14 0500  NA 140  < > 142  K 3.8  < > 3.5*  CL 102  < > 103  CO2 24  < > 28  BUN 17  < > 15  CREATININE 0.96  < > 1.02  CALCIUM 9.3  < > 8.7  PROT 7.2  --   --   BILITOT 3.9*  --   --   ALKPHOS 91  --   --   ALT 15  --   --   AST 23  --   --   GLUCOSE 106*  < > 93  < > = values in this interval not displayed. No results found for this basename: CHOL,  HDL,  LDLCALC,  TRIG   BNP (last 3 results)  Recent Labs  04/20/14 2159 04/28/14 0811 05/05/14 1610  PROBNP 2601.0* 4523.0* 5876.0*    Diagnostic Studies/Procedures   No results found.  Discharge Medications     Medication List    STOP taking these medications        amLODipine 10 MG tablet  Commonly known as:  NORVASC     benazepril 40 MG tablet  Commonly known as:  LOTENSIN     isosorbide mononitrate 60 MG 24 hr tablet  Commonly known as:  IMDUR     levofloxacin 750 MG tablet  Commonly known as:  LEVAQUIN     predniSONE 20 MG tablet  Commonly known as:  DELTASONE      TAKE these medications       albuterol (2.5 MG/3ML) 0.083% nebulizer solution  Commonly known as:  PROVENTIL  Take 3 mLs (2.5 mg total) by nebulization every 6 (six) hours as needed for wheezing or shortness of breath.     atorvastatin 40 MG tablet  Commonly known as:  LIPITOR  Take 1 tablet (40 mg total) by mouth daily.     carvedilol 3.125 MG tablet  Commonly known as:  COREG  Take 1 tablet (3.125 mg total) by mouth 2 (two) times daily with a meal.     digoxin 0.125 MG tablet  Commonly known as:  LANOXIN  Take 1 tablet (0.125 mg total) by mouth daily.     furosemide 40 MG tablet  Commonly known as:  LASIX  Take 1 tablet (40 mg total) by mouth 2 (two) times daily.     isosorbide-hydrALAZINE 20-37.5 MG per tablet  Commonly known as:  BIDIL  Take 1 tablet by mouth 3 (three) times daily.     lisinopril 2.5 MG tablet  Commonly known as:  PRINIVIL,ZESTRIL  Take 1 tablet (2.5 mg total) by mouth daily.     magnesium oxide 400 MG tablet  Commonly known as:  MAG-OX  Take 400 mg by mouth 2 (two) times daily.     milrinone 20 MG/100ML Soln infusion  Commonly known as:  PRIMACOR  Inject 21.05 mcg/min into the vein continuous.     omeprazole 20 MG capsule  Commonly known as:  PRILOSEC  Take 1 capsule (20 mg total) by mouth 2 (two) times daily before a meal.     potassium chloride 10 MEQ tablet  Commonly known as:  K-DUR  Take 2 tablets (20 mEq total) by mouth 2 (two) times daily.     rivaroxaban 20 MG Tabs tablet  Commonly known as:  XARELTO  Take 1 tablet (20 mg total) by mouth daily with supper.     sertraline  50 MG tablet  Commonly known as:  ZOLOFT  Take  1 tablet (50 mg total) by mouth daily.     spironolactone 25 MG tablet  Commonly known as:  ALDACTONE  Take 1 tablet (25 mg total) by mouth daily.        Disposition   The patient will be discharged in stable condition to home. Discharge Instructions   ACE Inhibitor / ARB already ordered    Complete by:  As directed      Diet - low sodium heart healthy    Complete by:  As directed      Heart Failure patients record your daily weight using the same scale at the same time of day    Complete by:  As directed      Increase activity slowly    Complete by:  As directed           Follow-up Information   Follow up with Arvilla Meres, MD On 05/16/2014. (at 8:45 [address] [phone])    Specialty:  Cardiology   Contact information:   [address] [place] [address] [phone]       Follow up with Dr Rayfield Citizen  On 05/31/2014. (Dr Lahoma Rocker at 10:30 )         Duration of Discharge Encounter: Greater than 35 minutes   Signed, Chloe Baig  NP-C  05/11/2014, 3:12 PM

## 2014-05-16 ENCOUNTER — Inpatient Hospital Stay (HOSPITAL_COMMUNITY): Admit: 2014-05-16 | Payer: Medicare Other

## 2014-05-17 ENCOUNTER — Telehealth (HOSPITAL_COMMUNITY): Payer: Self-pay | Admitting: Vascular Surgery

## 2014-05-17 NOTE — Telephone Encounter (Signed)
Please call pt about medication and his devise is beeping.Marland Kitchen He is getting on a bus back home @ 1:20 today .Marland Kitchen Please advise

## 2014-05-17 NOTE — Telephone Encounter (Signed)
Spoke w/pt he states milrinone bag is empty, his new bag was sent to his home in [place] b/c he was suppose to be back home their however he has stayed in [place] longer.  Advised I can have [facility] come out and connect new bag or he can come to ER but either way he will not be able to get this done and make a bus at 1:20, he states he has to catch the bus and can not wait.  He states he will cut pump off and get his new bag connected as soon as he gets home

## 2014-05-21 NOTE — Discharge Summary (Signed)
Patient seen and examined with Tonye Becket, NP. We discussed all aspects of the encounter. I agree with the assessment and plan as stated above.   Agree with discharge today. Please see rounding note from discharge day for other details.   Vaibhav Fogleman,MD 8:51 AM

## 2014-05-27 ENCOUNTER — Encounter (HOSPITAL_COMMUNITY): Payer: Self-pay | Admitting: Emergency Medicine

## 2014-05-27 ENCOUNTER — Inpatient Hospital Stay (HOSPITAL_COMMUNITY)
Admission: EM | Admit: 2014-05-27 | Discharge: 2014-06-01 | DRG: 291 | Disposition: A | Discharge status: Home / Self Care | Payer: Medicare Other | Attending: Internal Medicine | Admitting: Internal Medicine

## 2014-05-27 ENCOUNTER — Emergency Department (HOSPITAL_COMMUNITY): Payer: Medicare Other

## 2014-05-27 DIAGNOSIS — R079 Chest pain, unspecified: Secondary | ICD-10-CM

## 2014-05-27 DIAGNOSIS — I252 Old myocardial infarction: Secondary | ICD-10-CM

## 2014-05-27 DIAGNOSIS — I5023 Acute on chronic systolic (congestive) heart failure: Secondary | ICD-10-CM | POA: Diagnosis not present

## 2014-05-27 DIAGNOSIS — R570 Cardiogenic shock: Secondary | ICD-10-CM

## 2014-05-27 DIAGNOSIS — I059 Rheumatic mitral valve disease, unspecified: Secondary | ICD-10-CM | POA: Diagnosis present

## 2014-05-27 DIAGNOSIS — I482 Chronic atrial fibrillation, unspecified: Secondary | ICD-10-CM

## 2014-05-27 DIAGNOSIS — Z95810 Presence of automatic (implantable) cardiac defibrillator: Secondary | ICD-10-CM

## 2014-05-27 DIAGNOSIS — F17200 Nicotine dependence, unspecified, uncomplicated: Secondary | ICD-10-CM | POA: Diagnosis present

## 2014-05-27 DIAGNOSIS — K7460 Unspecified cirrhosis of liver: Secondary | ICD-10-CM | POA: Diagnosis present

## 2014-05-27 DIAGNOSIS — I10 Essential (primary) hypertension: Secondary | ICD-10-CM | POA: Diagnosis present

## 2014-05-27 DIAGNOSIS — I079 Rheumatic tricuspid valve disease, unspecified: Secondary | ICD-10-CM | POA: Diagnosis present

## 2014-05-27 DIAGNOSIS — I5033 Acute on chronic diastolic (congestive) heart failure: Secondary | ICD-10-CM

## 2014-05-27 DIAGNOSIS — D509 Iron deficiency anemia, unspecified: Secondary | ICD-10-CM | POA: Diagnosis present

## 2014-05-27 DIAGNOSIS — Z91199 Patient's noncompliance with other medical treatment and regimen due to unspecified reason: Secondary | ICD-10-CM

## 2014-05-27 DIAGNOSIS — Z79899 Other long term (current) drug therapy: Secondary | ICD-10-CM

## 2014-05-27 DIAGNOSIS — Z9861 Coronary angioplasty status: Secondary | ICD-10-CM

## 2014-05-27 DIAGNOSIS — G4733 Obstructive sleep apnea (adult) (pediatric): Secondary | ICD-10-CM | POA: Diagnosis present

## 2014-05-27 DIAGNOSIS — I2510 Atherosclerotic heart disease of native coronary artery without angina pectoris: Secondary | ICD-10-CM | POA: Diagnosis present

## 2014-05-27 DIAGNOSIS — I428 Other cardiomyopathies: Secondary | ICD-10-CM | POA: Diagnosis present

## 2014-05-27 DIAGNOSIS — Z7901 Long term (current) use of anticoagulants: Secondary | ICD-10-CM

## 2014-05-27 DIAGNOSIS — Z9119 Patient's noncompliance with other medical treatment and regimen: Secondary | ICD-10-CM

## 2014-05-27 DIAGNOSIS — I4891 Unspecified atrial fibrillation: Secondary | ICD-10-CM | POA: Diagnosis present

## 2014-05-27 DIAGNOSIS — I509 Heart failure, unspecified: Secondary | ICD-10-CM | POA: Diagnosis present

## 2014-05-27 HISTORY — DX: Iron deficiency anemia, unspecified: D50.9

## 2014-05-27 HISTORY — DX: Chronic systolic (congestive) heart failure: I50.22

## 2014-05-27 LAB — CBC
HCT: 30.2 % — ABNORMAL LOW (ref 39.0–52.0)
Hemoglobin: 9.4 g/dL — ABNORMAL LOW (ref 13.0–17.0)
MCH: 22.9 pg — ABNORMAL LOW (ref 26.0–34.0)
MCHC: 31.1 g/dL (ref 30.0–36.0)
MCV: 73.5 fL — ABNORMAL LOW (ref 78.0–100.0)
Platelets: 304 10*3/uL (ref 150–400)
RBC: 4.11 MIL/uL — ABNORMAL LOW (ref 4.22–5.81)
RDW: 20.9 % — ABNORMAL HIGH (ref 11.5–15.5)
WBC: 4.4 10*3/uL (ref 4.0–10.5)

## 2014-05-27 LAB — BASIC METABOLIC PANEL WITH GFR
Anion gap: 14 (ref 5–15)
BUN: 18 mg/dL (ref 6–23)
CO2: 23 meq/L (ref 19–32)
Calcium: 9.3 mg/dL (ref 8.4–10.5)
Chloride: 102 meq/L (ref 96–112)
Creatinine, Ser: 1.12 mg/dL (ref 0.50–1.35)
GFR calc Af Amer: 88 mL/min — ABNORMAL LOW
GFR calc non Af Amer: 76 mL/min — ABNORMAL LOW
Glucose, Bld: 83 mg/dL (ref 70–99)
Potassium: 3.7 meq/L (ref 3.7–5.3)
Sodium: 139 meq/L (ref 137–147)

## 2014-05-27 LAB — TROPONIN I: Troponin I: <0.3 ng/mL

## 2014-05-27 LAB — PRO B NATRIURETIC PEPTIDE: PRO B NATRI PEPTIDE: 3625 pg/mL — AB (ref 0–125)

## 2014-05-27 MED ORDER — POTASSIUM CHLORIDE CRYS ER 20 MEQ PO TBCR
40.0000 meq | EXTENDED_RELEASE_TABLET | Freq: Once | ORAL | Status: AC
  Administered 2014-05-27: 40 meq via ORAL
  Filled 2014-05-27: qty 2

## 2014-05-27 MED ORDER — FUROSEMIDE 10 MG/ML IJ SOLN
80.0000 mg | Freq: Once | INTRAMUSCULAR | Status: AC
  Administered 2014-05-27: 80 mg via INTRAVENOUS
  Filled 2014-05-27: qty 8

## 2014-05-27 MED ORDER — NITROGLYCERIN 0.4 MG SL SUBL
0.4000 mg | SUBLINGUAL_TABLET | SUBLINGUAL | Status: DC | PRN
  Administered 2014-05-27 – 2014-05-29 (×5): 0.4 mg via SUBLINGUAL
  Filled 2014-05-27 (×5): qty 1

## 2014-05-27 NOTE — ED Notes (Signed)
He had a chest xray 2-3 days ago with these same symptoms

## 2014-05-27 NOTE — ED Provider Notes (Signed)
CSN: [id number]     Arrival date & time 05/27/14  1951 History   First MD Initiated Contact with Patient 05/27/14 2210     Chief Complaint  Patient presents with  . Chest Pain    Patient is a 48 y.o. male presenting with chest pain and shortness of breath. The history is provided by the patient.  Chest Pain Chest pain location: across anterior chest. Pain quality: pressure   Pain radiates to:  Does not radiate Pain radiates to the back: yes   Pain severity now: 10/10. Onset quality:  Gradual Duration:  4 days Timing:  Constant Progression:  Unchanged Chronicity:  Recurrent Context: movement and at rest   Context: not breathing   Context comment:  Walking Associated symptoms: back pain, cough, lower extremity edema (1 week bilasteral) and shortness of breath   Associated symptoms: no fever, no nausea, no orthopnea and no palpitations   Shortness of Breath Associated symptoms: chest pain and cough   Associated symptoms: no fever    No change in diet. Voiding well.  Next cardiology appt in October, Dr. Cliffton Asters  Past Medical History  Diagnosis Date  . Essential hypertension, benign   . MI (myocardial infarction)   . ICD (implantable cardioverter-defibrillator) in place   . Chronic atrial fibrillation   . Cardiomyopathy     Possibly nonischemic based on that information, LVEF 15%  . Sleep apnea   . Coronary atherosclerosis of native coronary artery     PCTA 2005 - single vessel, reportedly patent 2012  . Noncompliance   . Cirrhosis    Past Surgical History  Procedure Laterality Date  . Tonsillectomy    . Ep implantable device     Family History  Problem Relation Age of Onset  . Heart disease Father   . Heart disease Sister   . Heart disease Mother    History  Substance Use Topics  . Smoking status: Current Every Day Smoker -- 0.50 packs/day for 15 years    Types: Cigarettes  . Smokeless tobacco: Not on file  . Alcohol Use: Yes     Comment: occasional    Review  of Systems  Constitutional: Negative for fever.  Respiratory: Positive for cough and shortness of breath.   Cardiovascular: Positive for chest pain. Negative for palpitations and orthopnea.  Gastrointestinal: Negative for nausea.  Musculoskeletal: Positive for back pain.     Allergies  Vicodin and Tramadol  Home Medications   Prior to Admission medications   Medication Sig Start Date End Date Taking? Authorizing Provider  albuterol (PROVENTIL) (2.5 MG/3ML) 0.083% nebulizer solution Take 3 mLs (2.5 mg total) by nebulization every 6 (six) hours as needed for wheezing or shortness of breath. 04/30/14  Yes Osvaldo Shipper, MD  atorvastatin (LIPITOR) 40 MG tablet Take 1 tablet (40 mg total) by mouth daily. 04/30/14  Yes Osvaldo Shipper, MD  carvedilol (COREG) 3.125 MG tablet Take 1 tablet (3.125 mg total) by mouth 2 (two) times daily with a meal. 05/11/14  Yes Amy D Clegg, NP  digoxin (LANOXIN) 0.125 MG tablet Take 1 tablet (0.125 mg total) by mouth daily. 04/30/14  Yes Osvaldo Shipper, MD  furosemide (LASIX) 40 MG tablet Take 1 tablet (40 mg total) by mouth 2 (two) times daily. 04/30/14  Yes Osvaldo Shipper, MD  isosorbide-hydrALAZINE (BIDIL) 20-37.5 MG per tablet Take 1 tablet by mouth 3 (three) times daily. 05/11/14  Yes Amy D Clegg, NP  lisinopril (PRINIVIL,ZESTRIL) 2.5 MG tablet Take 1 tablet (2.5 mg total)  by mouth daily. 05/11/14  Yes Amy D Clegg, NP  magnesium oxide (MAG-OX) 400 MG tablet Take 400 mg by mouth 2 (two) times daily.   Yes Historical Provider, MD  omeprazole (PRILOSEC) 20 MG capsule Take 1 capsule (20 mg total) by mouth 2 (two) times daily before a meal. 04/30/14  Yes Osvaldo Shipper, MD  potassium chloride (K-DUR) 10 MEQ tablet Take 2 tablets (20 mEq total) by mouth 2 (two) times daily. 05/11/14  Yes Amy D Clegg, NP  rivaroxaban (XARELTO) 20 MG TABS tablet Take 1 tablet (20 mg total) by mouth daily with supper. 04/30/14  Yes Osvaldo Shipper, MD  sertraline (ZOLOFT) 50 MG tablet Take 1  tablet (50 mg total) by mouth daily. 04/30/14  Yes Osvaldo Shipper, MD  spironolactone (ALDACTONE) 25 MG tablet Take 1 tablet (25 mg total) by mouth daily. 05/11/14  Yes Amy D Clegg, NP   BP 125/80  Pulse 76  Temp(Src) 98.5 F (36.9 C)  Resp 12  Ht  (1.778 m)  Wt 183 lb (83.008 kg)  BMI 26.26 kg/m2  SpO2 97% Physical Exam  Nursing note and vitals reviewed. Constitutional: He is oriented to person, place, and time. He appears well-developed and well-nourished. No distress.  HENT:  Head: Normocephalic and atraumatic.  Nose: Nose normal.  Mouth/Throat: Oropharynx is clear and moist. No oropharyngeal exudate.  Eyes: Conjunctivae are normal.  Neck: Normal range of motion. Neck supple. JVD (to mandible) present. No tracheal deviation present.  Cardiovascular: Normal rate and regular rhythm.   No murmur heard. S3  Pulmonary/Chest: Effort normal. No respiratory distress. He has no rales.  Decreased breath sounds at abses  Abdominal: Soft. Bowel sounds are normal. He exhibits no distension and no mass. There is no tenderness.  Musculoskeletal: Normal range of motion. He exhibits edema (2+ to below knee bilaterall ). He exhibits no tenderness.  Neurological: He is alert and oriented to person, place, and time.  Skin: Skin is warm and dry. No rash noted.  Psychiatric: He has a normal mood and affect.    ED Course  Procedures (including critical care time) Labs Review Labs Reviewed  CBC - Abnormal; Notable for the following:    RBC 4.11 (*)    Hemoglobin 9.4 (*)    HCT 30.2 (*)    MCV 73.5 (*)    MCH 22.9 (*)    RDW 20.9 (*)    All other components within normal limits  BASIC METABOLIC PANEL - Abnormal; Notable for the following:    GFR calc non Af Amer 76 (*)    GFR calc Af Amer 88 (*)    All other components within normal limits  PRO B NATRIURETIC PEPTIDE - Abnormal; Notable for the following:    Pro B Natriuretic peptide (BNP) 3625.0 (*)    All other components within  normal limits  TROPONIN I  TROPONIN I    Imaging Review Dg Chest 2 View  05/27/2014   CLINICAL DATA:  Chest pain.  History of smoking.  EXAM: CHEST  2 VIEW  COMPARISON:  Chest radiograph performed 05/05/2014  FINDINGS: The lungs are well-aerated. Pulmonary vascularity is at the upper limits of normal. There is no evidence of focal opacification, pleural effusion or pneumothorax.  The heart is enlarged. A pacemaker/AICD is noted at the left chest wall, with leads ending at the right atrium and right ventricle. No acute osseous abnormalities are seen.  IMPRESSION: No acute cardiopulmonary process seen.  Cardiomegaly noted.   Electronically Signed  By: Roanna Raider M.D.   On: 05/27/2014 23:48     EKG Interpretation   Date/Time:  Friday May 27 2014 19:59:08 EDT Ventricular Rate:  84 PR Interval:    QRS Duration: 114 QT Interval:  372 QTC Calculation: 439 R Axis:   3 Text Interpretation:  Atrial fibrillation with premature ventricular or  aberrantly conducted complexes Low voltage QRS Incomplete left bundle  branch block Nonspecific ST and T wave abnormality Abnormal ECG No  significant change since last tracing artifact noted Confirmed by Bebe Shaggy   MD, Dorinda Hill ([id number]) on 05/27/2014 9:35:36 PM      MDM   Final diagnoses:  Acute on chronic diastolic CHF (congestive heart failure), NYHA class 4  Chest pain at rest    PMH of chronic systolic heart failure,s/p Biotronik ICD/pacemaker in 2011, HLD, ischemic cardiomyopathy, CAD (coronary artery disease) 08/14/04 Angioplasty x 1 vessel 11/05, cardiac catheterization Feb 2012 20% LCx & RCA, chronic atrial fibrillation, alcohol and marijuana abuse, cirrhosis  Persistent chest pain for 4 days with dyspnea.  VSS, not in distress.  20 lb weight gain as well. JVD noted with lower extremity edema.  EKG unchanged compared to prior, no ischemic changes.  Suspect acute exacerbation of sCHF. Signs of volume overload on exam.   Delta trop  negative.  Considered PE but primary diagnosis of acute on chronic condition much more likely. CXR without consolidation or fulminant edema.  BNP elevated but below prior level of admission.  Gave NTG, Lasix, potassium.   NTG did not relieve chest pain.    12:50 AM consulted cardiology. Plan for admission.   Sofie Rower, MD 05/28/14 [phone]

## 2014-05-27 NOTE — ED Notes (Signed)
The pt has had  Rt and lt upper chest pain fir 304 days.  He reports that he has been seen in  numeruys eds but the pt reoports that he was not diagnosed with anything.  He has had swelling of both feet and ankles since then.  He has a pacemaker.  He has a picc line that has iv med going through it.    ?? milnirone

## 2014-05-28 ENCOUNTER — Encounter (HOSPITAL_COMMUNITY): Payer: Self-pay

## 2014-05-28 DIAGNOSIS — I079 Rheumatic tricuspid valve disease, unspecified: Secondary | ICD-10-CM | POA: Diagnosis present

## 2014-05-28 DIAGNOSIS — Z9861 Coronary angioplasty status: Secondary | ICD-10-CM | POA: Diagnosis not present

## 2014-05-28 DIAGNOSIS — I252 Old myocardial infarction: Secondary | ICD-10-CM | POA: Diagnosis not present

## 2014-05-28 DIAGNOSIS — I2510 Atherosclerotic heart disease of native coronary artery without angina pectoris: Secondary | ICD-10-CM | POA: Diagnosis present

## 2014-05-28 DIAGNOSIS — D509 Iron deficiency anemia, unspecified: Secondary | ICD-10-CM | POA: Diagnosis present

## 2014-05-28 DIAGNOSIS — I509 Heart failure, unspecified: Secondary | ICD-10-CM

## 2014-05-28 DIAGNOSIS — G4733 Obstructive sleep apnea (adult) (pediatric): Secondary | ICD-10-CM | POA: Diagnosis present

## 2014-05-28 DIAGNOSIS — Z95810 Presence of automatic (implantable) cardiac defibrillator: Secondary | ICD-10-CM | POA: Diagnosis not present

## 2014-05-28 DIAGNOSIS — Z9119 Patient's noncompliance with other medical treatment and regimen: Secondary | ICD-10-CM | POA: Diagnosis not present

## 2014-05-28 DIAGNOSIS — I4891 Unspecified atrial fibrillation: Secondary | ICD-10-CM

## 2014-05-28 DIAGNOSIS — I059 Rheumatic mitral valve disease, unspecified: Secondary | ICD-10-CM | POA: Diagnosis present

## 2014-05-28 DIAGNOSIS — I5033 Acute on chronic diastolic (congestive) heart failure: Secondary | ICD-10-CM

## 2014-05-28 DIAGNOSIS — Z79899 Other long term (current) drug therapy: Secondary | ICD-10-CM | POA: Diagnosis not present

## 2014-05-28 DIAGNOSIS — I428 Other cardiomyopathies: Secondary | ICD-10-CM

## 2014-05-28 DIAGNOSIS — R079 Chest pain, unspecified: Secondary | ICD-10-CM | POA: Diagnosis present

## 2014-05-28 DIAGNOSIS — I10 Essential (primary) hypertension: Secondary | ICD-10-CM | POA: Diagnosis present

## 2014-05-28 DIAGNOSIS — Z91199 Patient's noncompliance with other medical treatment and regimen due to unspecified reason: Secondary | ICD-10-CM | POA: Diagnosis not present

## 2014-05-28 DIAGNOSIS — I5023 Acute on chronic systolic (congestive) heart failure: Secondary | ICD-10-CM

## 2014-05-28 DIAGNOSIS — R570 Cardiogenic shock: Secondary | ICD-10-CM | POA: Diagnosis present

## 2014-05-28 DIAGNOSIS — Z7901 Long term (current) use of anticoagulants: Secondary | ICD-10-CM | POA: Diagnosis not present

## 2014-05-28 DIAGNOSIS — K7460 Unspecified cirrhosis of liver: Secondary | ICD-10-CM | POA: Diagnosis present

## 2014-05-28 DIAGNOSIS — F17200 Nicotine dependence, unspecified, uncomplicated: Secondary | ICD-10-CM | POA: Diagnosis present

## 2014-05-28 LAB — TROPONIN I
Troponin I: <0.3 ng/mL (ref <0.30)
Troponin I: <0.3 ng/mL (ref <0.30)
Troponin I: <0.3 ng/mL (ref <0.30)

## 2014-05-28 LAB — CBC WITH DIFFERENTIAL/PLATELET
BASOS ABS: 0.1 10*3/uL (ref 0.0–0.1)
Basophils Relative: 2 % — ABNORMAL HIGH (ref 0–1)
Eosinophils Absolute: 0.5 10*3/uL (ref 0.0–0.7)
Eosinophils Relative: 11 % — ABNORMAL HIGH (ref 0–5)
HEMATOCRIT: 30.8 % — AB (ref 39.0–52.0)
HEMOGLOBIN: 9.6 g/dL — AB (ref 13.0–17.0)
LYMPHS ABS: 1.3 10*3/uL (ref 0.7–4.0)
Lymphocytes Relative: 30 % (ref 12–46)
MCH: 22.5 pg — ABNORMAL LOW (ref 26.0–34.0)
MCHC: 31.2 g/dL (ref 30.0–36.0)
MCV: 72.1 fL — ABNORMAL LOW (ref 78.0–100.0)
Monocytes Absolute: 0.5 10*3/uL (ref 0.1–1.0)
Monocytes Relative: 13 % — ABNORMAL HIGH (ref 3–12)
Neutro Abs: 1.9 10*3/uL (ref 1.7–7.7)
Neutrophils Relative %: 44 % (ref 43–77)
Platelets: 309 10*3/uL (ref 150–400)
RBC: 4.27 MIL/uL (ref 4.22–5.81)
RDW: 20.8 % — ABNORMAL HIGH (ref 11.5–15.5)
WBC: 4.2 10*3/uL (ref 4.0–10.5)

## 2014-05-28 LAB — MAGNESIUM: Magnesium: 1.8 mg/dL (ref 1.5–2.5)

## 2014-05-28 LAB — CARBOXYHEMOGLOBIN
CARBOXYHEMOGLOBIN: 1.8 % — AB (ref 0.5–1.5)
Methemoglobin: 0.4 % (ref 0.0–1.5)
O2 SAT: 46.6 %
TOTAL HEMOGLOBIN: 9.2 g/dL — AB (ref 13.5–18.0)

## 2014-05-28 LAB — MRSA PCR SCREENING: MRSA BY PCR: NEGATIVE

## 2014-05-28 LAB — DIGOXIN LEVEL: Digoxin Level: 0.4 ng/mL — ABNORMAL LOW (ref 0.8–2.0)

## 2014-05-28 LAB — FERRITIN: Ferritin: 30 ng/mL (ref 22–322)

## 2014-05-28 MED ORDER — ONDANSETRON HCL 4 MG/2ML IJ SOLN: 4.0000 mg | Freq: Four times a day (QID) | INTRAMUSCULAR | Status: DC | PRN

## 2014-05-28 MED ORDER — MILRINONE LACTATE 50 MG/50ML IV SOLN: INTRAVENOUS | Status: DC

## 2014-05-28 MED ORDER — RIVAROXABAN 20 MG PO TABS
20.0000 mg | ORAL_TABLET | Freq: Every day | ORAL | Status: DC
  Administered 2014-05-28 – 2014-05-31 (×4): 20 mg via ORAL
  Filled 2014-05-28 (×5): qty 1

## 2014-05-28 MED ORDER — FUROSEMIDE 10 MG/ML IJ SOLN
40.0000 mg | Freq: Two times a day (BID) | INTRAMUSCULAR | Status: DC
  Administered 2014-05-29: 40 mg via INTRAVENOUS
  Filled 2014-05-28 (×3): qty 4

## 2014-05-28 MED ORDER — ACETAMINOPHEN 325 MG PO TABS
650.0000 mg | ORAL_TABLET | ORAL | Status: DC | PRN
  Administered 2014-05-28 – 2014-06-01 (×8): 650 mg via ORAL
  Filled 2014-05-28 (×8): qty 2

## 2014-05-28 MED ORDER — SODIUM CHLORIDE 0.9 % IJ SOLN
3.0000 mL | Freq: Two times a day (BID) | INTRAMUSCULAR | Status: DC
  Administered 2014-05-28 – 2014-05-29 (×3): 3 mL via INTRAVENOUS
  Administered 2014-05-29: 10:00:00 via INTRAVENOUS
  Administered 2014-05-30 – 2014-05-31 (×3): 3 mL via INTRAVENOUS
  Administered 2014-06-01: 10 mL via INTRAVENOUS

## 2014-05-28 MED ORDER — ATORVASTATIN CALCIUM 40 MG PO TABS
40.0000 mg | ORAL_TABLET | Freq: Every day | ORAL | Status: DC
  Administered 2014-05-28 – 2014-06-01 (×5): 40 mg via ORAL
  Filled 2014-05-28 (×5): qty 1

## 2014-05-28 MED ORDER — MAGNESIUM SULFATE 40 MG/ML IJ SOLN
2.0000 g | Freq: Once | INTRAMUSCULAR | Status: AC
  Administered 2014-05-28: 2 g via INTRAVENOUS
  Filled 2014-05-28: qty 50

## 2014-05-28 MED ORDER — SODIUM CHLORIDE 0.9 % IV SOLN: 250.0000 mL | INTRAVENOUS | Status: DC | PRN

## 2014-05-28 MED ORDER — ISOSORB DINITRATE-HYDRALAZINE 20-37.5 MG PO TABS
1.0000 | ORAL_TABLET | Freq: Three times a day (TID) | ORAL | Status: DC
  Administered 2014-05-28 – 2014-06-01 (×13): 1 via ORAL
  Filled 2014-05-28 (×15): qty 1

## 2014-05-28 MED ORDER — SERTRALINE HCL 50 MG PO TABS
50.0000 mg | ORAL_TABLET | Freq: Every day | ORAL | Status: DC
  Administered 2014-05-28 – 2014-06-01 (×5): 50 mg via ORAL
  Filled 2014-05-28 (×5): qty 1

## 2014-05-28 MED ORDER — CARVEDILOL 3.125 MG PO TABS
3.1250 mg | ORAL_TABLET | Freq: Two times a day (BID) | ORAL | Status: DC
  Administered 2014-05-28 – 2014-05-29 (×3): 3.125 mg via ORAL
  Filled 2014-05-28 (×5): qty 1

## 2014-05-28 MED ORDER — PANTOPRAZOLE SODIUM 40 MG PO TBEC
40.0000 mg | DELAYED_RELEASE_TABLET | Freq: Every day | ORAL | Status: DC
  Administered 2014-05-28 – 2014-06-01 (×5): 40 mg via ORAL
  Filled 2014-05-28 (×5): qty 1

## 2014-05-28 MED ORDER — SODIUM CHLORIDE 0.9 % IJ SOLN: 3.0000 mL | INTRAMUSCULAR | Status: DC | PRN

## 2014-05-28 MED ORDER — SPIRONOLACTONE 25 MG PO TABS
25.0000 mg | ORAL_TABLET | Freq: Every day | ORAL | Status: DC
  Administered 2014-05-28 – 2014-06-01 (×5): 25 mg via ORAL
  Filled 2014-05-28 (×5): qty 1

## 2014-05-28 MED ORDER — ALBUTEROL SULFATE (2.5 MG/3ML) 0.083% IN NEBU: 2.5000 mg | INHALATION_SOLUTION | Freq: Four times a day (QID) | RESPIRATORY_TRACT | Status: DC | PRN

## 2014-05-28 MED ORDER — LISINOPRIL 2.5 MG PO TABS
2.5000 mg | ORAL_TABLET | Freq: Every day | ORAL | Status: DC
  Administered 2014-05-28 – 2014-05-31 (×4): 2.5 mg via ORAL
  Filled 2014-05-28 (×4): qty 1

## 2014-05-28 MED ORDER — MILRINONE IN DEXTROSE 20 MG/100ML IV SOLN: 0.2500 ug/kg/min | INTRAVENOUS | Status: DC

## 2014-05-28 NOTE — ED Provider Notes (Signed)
Patient seen/examined in the Emergency Department in conjunction with Resident Physician Provider Stengel Patient reports SOB and CP.  He has complicated cardiac history Exam : awake/alert.  No distress noted.  He does have basilar crackles noted Plan: consult cardiology    Joya Gaskins, MD 05/28/14 [phone]

## 2014-05-28 NOTE — Discharge Instructions (Signed)

## 2014-05-28 NOTE — H&P (Signed)
Cardiology History and Physical  PCP: No PCP Per Patient Cardiologist: Dr. Mickie Bail, [facility] Cardiovascular  History of Present Illness (and review of medical records): Tim Reyes is a 48 y.o. male who presents for evaluation of chest pain and dyspnea on exertion.  He is from [place] near [employer] with a history of mixed ICM/NICM s/p ICD (Biotronik), chronic systolic heart failure, CAD s/p angioplasty x 1 vessel (2005), chronic atrial fibrillation, HTN, OSA and history of non-compliance. His last cath was Feb 2012: 20% LCx & RCA. He has had a PICC in place for 3 times a week dobutamine infusions. He has been admitted 3 times in the past month and he was evaluated 4 times in the ED for chest pain. He was just discharged from [facility] 04/2014 with ADHF.  He was started on Milrinone and diuresed 28lbs.  He has progressive advanced heart failure and needs an advanced therapy work up and after lengthy discussions he will pursue this a [facility] in [place]. He now returns with dyspnea on exertion and constant chest pressure.  This has been going on over the past 3-4 days.  He presented to OSH ED was evaluated and discharged home.  He also reports PND, 3-4 pillow orthopnea, and lower extremity swelling. At least 10lbs up from discharge. He reports compliance with medications.  He states that he is following dietary restrictions.  He has now yet seen Dr. Dema Severin.  He reports no problems with home milrinone therapy.  He denies any ICD discharges.  05/09/14 RHC/LHC  RA = 17  RV = 55/6/15  PA = 54/27 (38)  PCW = 20  Fick cardiac output/index = 4.2/2.0  PVR = 4.3 WU  FA sat = 98%  PA sat = 46%, 46%  LCX: Large vessel. Gives of 3 OMs. Diffuse 50% through mid AV groove LCX  RCA: Dominant vessel. Diffuse 30-40% through proximal and mid vessel   ECHO EF 15%, diff HK, biatrial enlargement, mod/severe MR, severe TR, RV sys fx mildly dilated   Review of Systems Further review of systems was  otherwise negative other than stated in HPI.  Patient Active Problem List   Diagnosis Date Noted  . Atrial fibrillation 05/06/2014  . Chest pain 05/05/2014  . CHF (congestive heart failure) 04/28/2014  . Acute on chronic diastolic CHF (congestive heart failure), NYHA class 4 04/28/2014  . Acute bronchiolitis 04/28/2014  . Chronic systolic congestive heart failure, NYHA class 4 04/21/2014  . Chronic atrial fibrillation 04/21/2014  . HTN (hypertension) 04/21/2014  . CAD- PCI 2005, cath 2012 OK 04/21/2014  . Non compliance w medication regimen 04/21/2014  . Liver cirrhosis 04/21/2014  . Tobacco abuse 04/21/2014  . AICD (automatic cardioverter/defibrillator) present 04/21/2014  . Congestive dilated cardiomyopathy 04/21/2014  . Noncompliance 04/21/2014  . Chest pain with moderate risk of acute coronary syndrome 04/20/2014   Past Medical History  Diagnosis Date  . Essential hypertension, benign   . MI (myocardial infarction)   . ICD (implantable cardioverter-defibrillator) in place   . Chronic atrial fibrillation   . Cardiomyopathy     Possibly nonischemic based on that information, LVEF 15%  . Sleep apnea   . Coronary atherosclerosis of native coronary artery     PCTA 2005 - single vessel, reportedly patent 2012  . Noncompliance   . Cirrhosis     Past Surgical History  Procedure Laterality Date  . Tonsillectomy    . Ep implantable device      Prescriptions prior to admission  Medication Sig Dispense Refill  . albuterol (PROVENTIL) (2.5 MG/3ML) 0.083% nebulizer solution Take 3 mLs (2.5 mg total) by nebulization every 6 (six) hours as needed for wheezing or shortness of breath.  75 mL  2  . atorvastatin (LIPITOR) 40 MG tablet Take 1 tablet (40 mg total) by mouth daily.  30 tablet  0  . carvedilol (COREG) 3.125 MG tablet Take 1 tablet (3.125 mg total) by mouth 2 (two) times daily with a meal.  60 tablet  6  . digoxin (LANOXIN) 0.125 MG tablet Take 1 tablet (0.125 mg total) by  mouth daily.  30 tablet  0  . furosemide (LASIX) 40 MG tablet Take 1 tablet (40 mg total) by mouth 2 (two) times daily.  60 tablet  0  . isosorbide-hydrALAZINE (BIDIL) 20-37.5 MG per tablet Take 1 tablet by mouth 3 (three) times daily.  90 tablet  6  . lisinopril (PRINIVIL,ZESTRIL) 2.5 MG tablet Take 1 tablet (2.5 mg total) by mouth daily.  30 tablet  6  . magnesium oxide (MAG-OX) 400 MG tablet Take 400 mg by mouth 2 (two) times daily.      Marland Kitchen omeprazole (PRILOSEC) 20 MG capsule Take 1 capsule (20 mg total) by mouth 2 (two) times daily before a meal.  60 capsule  0  . potassium chloride (K-DUR) 10 MEQ tablet Take 2 tablets (20 mEq total) by mouth 2 (two) times daily.  120 tablet  0  . rivaroxaban (XARELTO) 20 MG TABS tablet Take 1 tablet (20 mg total) by mouth daily with supper.  30 tablet  0  . sertraline (ZOLOFT) 50 MG tablet Take 1 tablet (50 mg total) by mouth daily.  30 tablet  0  . spironolactone (ALDACTONE) 25 MG tablet Take 1 tablet (25 mg total) by mouth daily.  30 tablet  6   Allergies  Allergen Reactions  . Vicodin [Hydrocodone-Acetaminophen] Nausea And Vomiting  . Tramadol Itching    History  Substance Use Topics  . Smoking status: Current Every Day Smoker -- 0.50 packs/day for 15 years    Types: Cigarettes  . Smokeless tobacco: Not on file  . Alcohol Use: Yes     Comment: occasional    Family History  Problem Relation Age of Onset  . Heart disease Father   . Heart disease Sister   . Heart disease Mother      Objective:  Patient Vitals for the past 8 hrs:  BP Temp Temp src Pulse Resp SpO2 Weight  05/28/14 0500 120/78 mmHg - - 92 31 93 % 85.5 kg (188 lb 7.9 oz)  05/28/14 0430 120/76 mmHg - - 65 15 97 % -  05/28/14 0415 106/78 mmHg 98.2 F (36.8 C) Oral 80 21 100 % -  05/28/14 0400 120/71 mmHg - - 61 17 99 % -  05/28/14 0345 134/85 mmHg - - 61 13 100 % -  05/28/14 0330 124/76 mmHg - - 81 21 94 % -  05/28/14 0315 136/91 mmHg - - 89 16 99 % -  05/28/14 0215 116/83  mmHg - - 50 - - -  05/28/14 0200 110/68 mmHg - - - - 97 % -  05/28/14 0145 125/82 mmHg - - 85 20 98 % -  05/28/14 0130 119/65 mmHg - - 66 15 100 % -  05/28/14 0108 - - - 73 24 96 % -  05/28/14 0100 136/87 mmHg - - - 17 - -  05/28/14 0030 143/104 mmHg - - - - - -  05/28/14 0015 127/100 mmHg - - - 17 - -  05/28/14 0000 117/82 mmHg - - 83 15 96 % -  05/27/14 2348 - - - - - - 88.814 kg (195 lb 12.8 oz)  05/27/14 2317 - - - - - - 92.987 kg (205 lb)  05/27/14 2315 116/74 mmHg - - 66 - 97 % -  05/27/14 2245 125/90 mmHg - - 68 - 97 % -   General appearance: alert, cooperative, appears stated age and no distress Head: Normocephalic, without obvious abnormality, atraumatic Eyes: conjunctivae/corneas clear. PERRL, EOM's intact. Neck: positive JVD and supple, Lungs: clear to auscultation, but decreased throughout, no wheezing Heart: irregular rate and rhythm, Abdomen: soft, non-tender; bowel sounds normal Extremities: 2+ BLE edema Neurologic: Grossly normal  Results for orders placed during the hospital encounter of 05/27/14 (from the past 48 hour(s))  CBC     Status: Abnormal   Collection Time    05/27/14  8:08 PM      Result Value Ref Range   WBC 4.4  4.0 - 10.5 K/uL   RBC 4.11 (*) 4.22 - 5.81 MIL/uL   Hemoglobin 9.4 (*) 13.0 - 17.0 g/dL   HCT 30.2 (*) 39.0 - 52.0 %   MCV 73.5 (*) 78.0 - 100.0 fL   MCH 22.9 (*) 26.0 - 34.0 pg   MCHC 31.1  30.0 - 36.0 g/dL   RDW 20.9 (*) 11.5 - 15.5 %   Platelets 304  150 - 400 K/uL  BASIC METABOLIC PANEL     Status: Abnormal   Collection Time    05/27/14  8:08 PM      Result Value Ref Range   Sodium 139  137 - 147 mEq/L   Potassium 3.7  3.7 - 5.3 mEq/L   Chloride 102  96 - 112 mEq/L   CO2 23  19 - 32 mEq/L   Glucose, Bld 83  70 - 99 mg/dL   BUN 18  6 - 23 mg/dL   Creatinine, Ser 1.12  0.50 - 1.35 mg/dL   Calcium 9.3  8.4 - 10.5 mg/dL   GFR calc non Af Amer 76 (*) >90 mL/min   GFR calc Af Amer 88 (*) >90 mL/min   Comment: (NOTE)     The eGFR  has been calculated using the CKD EPI equation.     This calculation has not been validated in all clinical situations.     eGFR's persistently <90 mL/min signify possible Chronic Kidney     Disease.   Anion gap 14  5 - 15  PRO B NATRIURETIC PEPTIDE     Status: Abnormal   Collection Time    05/27/14  8:08 PM      Result Value Ref Range   Pro B Natriuretic peptide (BNP) 3625.0 (*) 0 - 125 pg/mL  TROPONIN I     Status: None   Collection Time    05/27/14  8:08 PM      Result Value Ref Range   Troponin I <0.30  <0.30 ng/mL   Comment:            Due to the release kinetics of cTnI,     a negative result within the first hours     of the onset of symptoms does not rule out     myocardial infarction with certainty.     If myocardial infarction is still suspected,     repeat the test at appropriate intervals.  TROPONIN I  Status: None   Collection Time    05/27/14 11:26 PM      Result Value Ref Range   Troponin I <0.30  <0.30 ng/mL   Comment:            Due to the release kinetics of cTnI,     a negative result within the first hours     of the onset of symptoms does not rule out     myocardial infarction with certainty.     If myocardial infarction is still suspected,     repeat the test at appropriate intervals.  MRSA PCR SCREENING     Status: None   Collection Time    05/28/14  3:15 AM      Result Value Ref Range   MRSA by PCR NEGATIVE  NEGATIVE   Comment:            The GeneXpert MRSA Assay (FDA     approved for NASAL specimens     only), is one component of a     comprehensive MRSA colonization     surveillance program. It is not     intended to diagnose MRSA     infection nor to guide or     monitor treatment for     MRSA infections.  CBC WITH DIFFERENTIAL     Status: Abnormal   Collection Time    05/28/14  5:12 AM      Result Value Ref Range   WBC 4.2  4.0 - 10.5 K/uL   RBC 4.27  4.22 - 5.81 MIL/uL   Hemoglobin 9.6 (*) 13.0 - 17.0 g/dL   HCT 30.8 (*) 39.0 -  52.0 %   MCV 72.1 (*) 78.0 - 100.0 fL   MCH 22.5 (*) 26.0 - 34.0 pg   MCHC 31.2  30.0 - 36.0 g/dL   RDW 20.8 (*) 11.5 - 15.5 %   Platelets 309  150 - 400 K/uL   Neutrophils Relative % 44  43 - 77 %   Neutro Abs 1.9  1.7 - 7.7 K/uL   Lymphocytes Relative 30  12 - 46 %   Lymphs Abs 1.3  0.7 - 4.0 K/uL   Monocytes Relative 13 (*) 3 - 12 %   Monocytes Absolute 0.5  0.1 - 1.0 K/uL   Eosinophils Relative 11 (*) 0 - 5 %   Eosinophils Absolute 0.5  0.0 - 0.7 K/uL   Basophils Relative 2 (*) 0 - 1 %   Basophils Absolute 0.1  0.0 - 0.1 K/uL   Dg Chest 2 View  05/27/2014   CLINICAL DATA:  Chest pain.  History of smoking.  EXAM: CHEST  2 VIEW  COMPARISON:  Chest radiograph performed 05/05/2014  FINDINGS: The lungs are well-aerated. Pulmonary vascularity is at the upper limits of normal. There is no evidence of focal opacification, pleural effusion or pneumothorax.  The heart is enlarged. A pacemaker/AICD is noted at the left chest wall, with leads ending at the right atrium and right ventricle. No acute osseous abnormalities are seen.  IMPRESSION: No acute cardiopulmonary process seen.  Cardiomegaly noted.   Electronically Signed   By: Garald Balding M.D.   On: 05/27/2014 23:48    ECG:  19:59 reviewed. HR 85 atrial fibrillation, PVCs, non specific intraventricular conduction delay, nonspecific ST-T changes, no significant change from prior ecgs  Assessment: 1) A/C systolic HF  -EF 11%  2) NICM/ICM  - s/p ICD Biotronik  3) [place] 4) HTN  5)  Frequent PVCs  6) CAD  - s/p angioplasty x 1 vessel 2005  7) Atrial fibrillation  8) Mod/severe MR  9) Severe TR  Plan: 1. Cardiology  Admission  2. Continuous monitoring on Telemetry. 3. Repeat ekg on admit, prn chest pain or arrythmia 4. Cycle cardiac enzymes, monitor electrolytes, Check digoxin level 5. Will continue milrinone gtt 6. IV Lasix 93m po BID, good response in ED 7. Continue BB, ACEi, Bidil, Aldactone as BP will allow. 8. Continue  Xarelto

## 2014-05-28 NOTE — Progress Notes (Signed)
  Dr. Skipper Cliche H&P from this am reviewed.   Patient well known to me from recent HF admission. He missed his f/u appt.   Admitted this am with recurrent HF and CP. CEs negative.   Improving with IV diuresis. Remains on milrinone. He looks good.   CVP 9-10.   Continue diuresis and milrinone. Hopefully home in am.   Given noncompliance will not be VAD candidate.  Daniel Bensimhon,MD 1:41 PM

## 2014-05-29 DIAGNOSIS — Z91199 Patient's noncompliance with other medical treatment and regimen due to unspecified reason: Secondary | ICD-10-CM

## 2014-05-29 DIAGNOSIS — I4891 Unspecified atrial fibrillation: Secondary | ICD-10-CM

## 2014-05-29 DIAGNOSIS — Z9119 Patient's noncompliance with other medical treatment and regimen: Secondary | ICD-10-CM

## 2014-05-29 DIAGNOSIS — I5023 Acute on chronic systolic (congestive) heart failure: Principal | ICD-10-CM

## 2014-05-29 DIAGNOSIS — R079 Chest pain, unspecified: Secondary | ICD-10-CM

## 2014-05-29 LAB — BASIC METABOLIC PANEL
Anion gap: 12 (ref 5–15)
BUN: 17 mg/dL (ref 6–23)
CHLORIDE: 101 meq/L (ref 96–112)
CO2: 24 mEq/L (ref 19–32)
Calcium: 9 mg/dL (ref 8.4–10.5)
Creatinine, Ser: 1.1 mg/dL (ref 0.50–1.35)
GFR calc Af Amer: >90 mL/min (ref >90)
GFR calc non Af Amer: 78 mL/min — ABNORMAL LOW (ref >90)
GLUCOSE: 88 mg/dL (ref 70–99)
POTASSIUM: 3.7 meq/L (ref 3.7–5.3)
SODIUM: 137 meq/L (ref 137–147)

## 2014-05-29 LAB — IRON AND TIBC
Iron: 17 ug/dL — ABNORMAL LOW (ref 42–135)
Saturation Ratios: 4 % — ABNORMAL LOW (ref 20–55)
TIBC: 389 ug/dL (ref 215–435)
UIBC: 372 ug/dL (ref 125–400)

## 2014-05-29 LAB — CARBOXYHEMOGLOBIN
CARBOXYHEMOGLOBIN: 1.4 % (ref 0.5–1.5)
Methemoglobin: 0.7 % (ref 0.0–1.5)
O2 SAT: 52.4 %
TOTAL HEMOGLOBIN: 9 g/dL — AB (ref 13.5–18.0)

## 2014-05-29 MED ORDER — FUROSEMIDE 10 MG/ML IJ SOLN
80.0000 mg | Freq: Two times a day (BID) | INTRAMUSCULAR | Status: DC
  Administered 2014-05-29 – 2014-05-31 (×4): 80 mg via INTRAVENOUS
  Filled 2014-05-29 (×5): qty 8

## 2014-05-29 MED ORDER — SODIUM CHLORIDE 0.9 % IV SOLN
510.0000 mg | Freq: Once | INTRAVENOUS | Status: AC
  Administered 2014-05-29: 510 mg via INTRAVENOUS
  Filled 2014-05-29: qty 17

## 2014-05-29 MED ORDER — MILRINONE IN DEXTROSE 20 MG/100ML IV SOLN
0.3750 ug/kg/min | INTRAVENOUS | Status: DC
  Administered 2014-05-29 – 2014-06-01 (×7): 0.375 ug/kg/min via INTRAVENOUS
  Filled 2014-05-29 (×7): qty 100

## 2014-05-29 NOTE — Progress Notes (Addendum)
Advanced Heart Failure Rounding Note   Subjective:    48 y.o. male from [facility] with NICM s/p ICD (Biotronik), chronic systolic heart failure EF 10%, CAD s/p angioplasty x 1 vessel (2005), chronic atrial fibrillation, HTN, OSA and history of non-compliance. He was just discharged from [facility] 04/2014 with ADHF. He was started on Milrinone and diuresed 28lbs. Cath with ok coronaries.  Missed f/u clinic appt in CHF clinic. D/c weight 179.   Readmitted with CP and CHF.   Remains on milrinone and IV lasix. I/Os + overnight. Occasional CP. Complains of dyspnea on any exertion . Co-ox 47%-> 52%.     Objective:   Weight Range:  Vital Signs:   Temp:  [98.1 F (36.7 C)-98.3 F (36.8 C)] 98.1 F (36.7 C) (09/13 1135) Pulse Rate:  [72-88] 80 (09/13 1135) Resp:  [18-20] 20 (09/13 1135) BP: (92-132)/(56-102) 92/56 mmHg (09/13 1135) SpO2:  [98 %-100 %] 100 % (09/13 1135) Weight:  [86.637 kg (191 lb)] 86.637 kg (191 lb) (09/13 0317) Last BM Date: 05/29/14  Weight change: Filed Weights   05/27/14 2348 05/28/14 0500 05/29/14 0317  Weight: 88.814 kg (195 lb 12.8 oz) 85.5 kg (188 lb 7.9 oz) 86.637 kg (191 lb)    Intake/Output:   Intake/Output Summary (Last 24 hours) at 05/29/14 1245 Last data filed at 05/29/14 1100  Gross per 24 hour  Intake  500.8 ml  Output   1100 ml  Net -599.2 ml     Physical Exam: General:  Well appearing. No resp difficulty HEENT: normal Neck: supple. JVP 8-9 . Carotids 2+ bilat; no bruits. No lymphadenopathy or thryomegaly appreciated. Cor: PMI laterally displaced. Irregular rate & rhythm. +s3 Lungs: clear Abdomen: soft, nontender, nondistended. No hepatosplenomegaly. No bruits or masses. Good bowel sounds. Extremities: no cyanosis, clubbing, rash, edema Neuro: alert & orientedx3, cranial nerves grossly intact. moves all 4 extremities w/o difficulty. Affect pleasant  Telemetry: Chronic AF  Labs: Basic Metabolic Panel:  Recent Labs Lab 05/27/14 2008  05/28/14 0512 05/29/14 0500  NA 139  --  137  K 3.7  --  3.7  CL 102  --  101  CO2 23  --  24  GLUCOSE 83  --  88  BUN 18  --  17  CREATININE 1.12  --  1.10  CALCIUM 9.3  --  9.0  MG  --  1.8  --     Liver Function Tests: No results found for this basename: AST, ALT, ALKPHOS, BILITOT, PROT, ALBUMIN,  in the last 168 hours No results found for this basename: LIPASE, AMYLASE,  in the last 168 hours No results found for this basename: AMMONIA,  in the last 168 hours  CBC:  Recent Labs Lab 05/27/14 2008 05/28/14 0512  WBC 4.4 4.2  NEUTROABS  --  1.9  HGB 9.4* 9.6*  HCT 30.2* 30.8*  MCV 73.5* 72.1*  PLT 304 309    Cardiac Enzymes:  Recent Labs Lab 05/27/14 2008 05/27/14 2326 05/28/14 0512 05/28/14 1125  TROPONINI <0.30 <0.30 <0.30 <0.30    BNP: BNP (last 3 results)  Recent Labs  04/28/14 0811 05/05/14 1610 05/27/14 2008  PROBNP 4523.0* 5876.0* 3625.0*     Other results:    Imaging: Dg Chest 2 View  05/27/2014   CLINICAL DATA:  Chest pain.  History of smoking.  EXAM: CHEST  2 VIEW  COMPARISON:  Chest radiograph performed 05/05/2014  FINDINGS: The lungs are well-aerated. Pulmonary vascularity is at the upper limits of normal. There is  no evidence of focal opacification, pleural effusion or pneumothorax.  The heart is enlarged. A pacemaker/AICD is noted at the left chest wall, with leads ending at the right atrium and right ventricle. No acute osseous abnormalities are seen.  IMPRESSION: No acute cardiopulmonary process seen.  Cardiomegaly noted.   Electronically Signed   By: Jeffery  ChangRoanna Raider 05/27/2014 23:48      Medications:     Scheduled Medications: . atorvastatin  40 mg Oral Daily  . carvedilol  3.125 mg Oral BID WC  . furosemide  40 mg Intravenous BID  . isosorbide-hydrALAZINE  1 tablet Oral TID  . lisinopril  2.5 mg Oral Daily  . pantoprazole  40 mg Oral Daily  . rivaroxaban  20 mg Oral Q supper  . sertraline  50 mg Oral Daily  .  sodium chloride  3 mL Intravenous Q12H  . spironolactone  25 mg Oral Daily     Infusions: Marland Kitchen Milrinone Lactate 1.3 mL/hr at 05/28/14 0345     PRN Medications:  sodium chloride, acetaminophen, albuterol, nitroGLYCERIN, ondansetron (ZOFRAN) IV, sodium chloride   Assessment:  1. A/c systolic HF with EF 10-15% on milrinone 2. Mixed ischemic/NICM 3. Chest pain - coronaries ok on cath 8/15 4. Chronic AF 5. Anemia, iron deficiency   Plan/Discussion:    He continues to c/o class IB symptoms. Co-ox low despite milrinone. Will increase to 0.375. Follow CVPs and co-ox. Stop carvedilol.  May need to switch to dobutamine. He is not candidate for VAD due to noncompliance. Increase lasix to 80 IV bid.   CP not ischemic based on recent cath.   Will give IV iron for iron-def anemia.   Length of Stay: 2   Arvilla Meres MD 05/29/2014, 12:45 PM  Advanced Heart Failure Team Pager [phone] (M-F; 7a - 4p)  Please contact [facility] for night-coverage after hours (4p -7a ) and weekends on [email or web address]

## 2014-05-29 NOTE — Care Management Note (Unsigned)
    Page 1 of 1   05/29/2014     9:38:10 AM CARE MANAGEMENT NOTE 05/29/2014  Patient:  [facility]   Account Number:  [id number]  Date Initiated:  05/29/2014  Documentation initiated by:  [facility]  Subjective/Objective Assessment:   adm: chest pain and dyspnea on exertion     Action/Plan:   discharge planning   Anticipated DC Date:  05/29/2014   Anticipated DC Plan:  HOME W HOME HEALTH SERVICES      DC Planning Services  CM consult      [facility] Choice  Resumption Of Svcs/PTA Provider   Choice offered to / List presented to:          [facility] arranged  HH-1 RN  HH-10 DISEASE MANAGEMENT      HH agency  [facility]   Status of service:  Completed, signed off Medicare Important Message given?   (If response is "NO", the following Medicare IM given date fields will be blank) Date Medicare IM given:   Medicare IM given by:   Date Additional Medicare IM given:   Additional Medicare IM given by:    Discharge Disposition:  HOME W HOME HEALTH SERVICES  Per UR Regulation:    If discussed at Long Length of Stay Meetings, dates discussed:    Comments:  05/29/14 09:00 CM called [facility] rep, Kristen to give notice pt is in hospital and may be discharged today with milrinone. Request of MD for resumption orders.  Waiting for orders-will continue to follow. Freddy Jaksch, BSN, CM [phone].

## 2014-05-29 NOTE — Progress Notes (Signed)
Pt stated he was having more chest pain. Stated he had this pain earlier and MD was made aware. I paged on call Cardiology Fellow and gave the patient SL Nitro x2. Vital signs have been stable. Spoke with MD about chest pain and he agrees with the earlier MD. He stated to give more nitro if needed but no new orders were placed. Pt states that the pain has gotten better. Will continue to assess and monitor the pt closely.

## 2014-05-30 DIAGNOSIS — R570 Cardiogenic shock: Secondary | ICD-10-CM

## 2014-05-30 LAB — BASIC METABOLIC PANEL
Anion gap: 12 (ref 5–15)
BUN: 16 mg/dL (ref 6–23)
CALCIUM: 8.8 mg/dL (ref 8.4–10.5)
CO2: 26 mEq/L (ref 19–32)
Chloride: 102 mEq/L (ref 96–112)
Creatinine, Ser: 1.18 mg/dL (ref 0.50–1.35)
GFR, EST AFRICAN AMERICAN: 83 mL/min — AB (ref >90)
GFR, EST NON AFRICAN AMERICAN: 71 mL/min — AB (ref >90)
GLUCOSE: 87 mg/dL (ref 70–99)
POTASSIUM: 3.5 meq/L — AB (ref 3.7–5.3)
Sodium: 140 mEq/L (ref 137–147)

## 2014-05-30 LAB — CARBOXYHEMOGLOBIN
Carboxyhemoglobin: 1.8 % — ABNORMAL HIGH (ref 0.5–1.5)
Methemoglobin: 0.6 % (ref 0.0–1.5)
O2 SAT: 69.9 %
Total hemoglobin: 8.7 g/dL — ABNORMAL LOW (ref 13.5–18.0)

## 2014-05-30 MED ORDER — POTASSIUM CHLORIDE CRYS ER 20 MEQ PO TBCR
40.0000 meq | EXTENDED_RELEASE_TABLET | Freq: Once | ORAL | Status: AC
  Administered 2014-05-30: 40 meq via ORAL
  Filled 2014-05-30: qty 2

## 2014-05-30 MED ORDER — POTASSIUM CHLORIDE ER 10 MEQ PO TBCR
20.0000 meq | EXTENDED_RELEASE_TABLET | Freq: Two times a day (BID) | ORAL | Status: DC
  Administered 2014-05-30 – 2014-06-01 (×4): 20 meq via ORAL
  Filled 2014-05-30 (×6): qty 2

## 2014-05-30 NOTE — Progress Notes (Signed)
Advanced Heart Failure Rounding Note   Subjective:    48 y.o. male from [facility] with NICM s/p ICD (Biotronik), chronic systolic heart failure EF 10%, CAD s/p angioplasty x 1 vessel (2005), chronic atrial fibrillation, HTN, OSA and history of non-compliance. He was just discharged from [facility] 04/2014 with ADHF. He was started on Milrinone and diuresed 28lbs. Cath with ok coronaries.  Missed f/u clinic appt in CHF clinic. D/c weight 179.   Readmitted with CP and CHF.   Milrinone increased to 0.375 yesterday due to low co-ox. IV lasix restarted. Weight down 5 pounds. Co-ox up to  70% CVP remains about 13. Feeling better. Breathing better. Still with occasional CP.   Received Feraheme yesterday.    Objective:   Weight Range:  Vital Signs:   Temp:  [97.5 F (36.4 C)-98.7 F (37.1 C)] 97.5 F (36.4 C) (09/14 1122) Resp:  [18-20] 20 (09/14 1122) BP: (100-135)/(65-93) 122/76 mmHg (09/14 1122) SpO2:  [95 %-100 %] 98 % (09/14 1122) Weight:  [84.641 kg (186 lb 9.6 oz)] 84.641 kg (186 lb 9.6 oz) (09/14 0400) Last BM Date: 05/29/14  Weight change: Filed Weights   05/28/14 0500 05/29/14 0317 05/30/14 0400  Weight: 85.5 kg (188 lb 7.9 oz) 86.637 kg (191 lb) 84.641 kg (186 lb 9.6 oz)    Intake/Output:   Intake/Output Summary (Last 24 hours) at 05/30/14 1336 Last data filed at 05/30/14 1200  Gross per 24 hour  Intake 314.55 ml  Output   4550 ml  Net -4235.45 ml     Physical Exam: General:  Well appearing. No resp difficulty HEENT: normal Neck: supple. JVP 13 . Carotids 2+ bilat; no bruits. No lymphadenopathy or thryomegaly appreciated. Cor: PMI laterally displaced. Irregular rate & rhythm. +s3 Lungs: clear Abdomen: soft, nontender, nondistended. No hepatosplenomegaly. No bruits or masses. Good bowel sounds. Extremities: no cyanosis, clubbing, rash, edema Neuro: alert & orientedx3, cranial nerves grossly intact. moves all 4 extremities w/o difficulty. Affect pleasant  Telemetry:  Chronic AF  Labs: Basic Metabolic Panel:  Recent Labs Lab 05/27/14 2008 05/28/14 0512 05/29/14 0500 05/30/14 0409  NA 139  --  137 140  K 3.7  --  3.7 3.5*  CL 102  --  101 102  CO2 23  --  24 26  GLUCOSE 83  --  88 87  BUN 18  --  17 16  CREATININE 1.12  --  1.10 1.18  CALCIUM 9.3  --  9.0 8.8  MG  --  1.8  --   --     Liver Function Tests: No results found for this basename: AST, ALT, ALKPHOS, BILITOT, PROT, ALBUMIN,  in the last 168 hours No results found for this basename: LIPASE, AMYLASE,  in the last 168 hours No results found for this basename: AMMONIA,  in the last 168 hours  CBC:  Recent Labs Lab 05/27/14 2008 05/28/14 0512  WBC 4.4 4.2  NEUTROABS  --  1.9  HGB 9.4* 9.6*  HCT 30.2* 30.8*  MCV 73.5* 72.1*  PLT 304 309    Cardiac Enzymes:  Recent Labs Lab 05/27/14 2008 05/27/14 2326 05/28/14 0512 05/28/14 1125  TROPONINI <0.30 <0.30 <0.30 <0.30    BNP: BNP (last 3 results)  Recent Labs  04/28/14 0811 05/05/14 1610 05/27/14 2008  PROBNP 4523.0* 5876.0* 3625.0*     Other results:    Imaging: No results found.   Medications:     Scheduled Medications: . atorvastatin  40 mg Oral Daily  . furosemide  80 mg Intravenous BID  . isosorbide-hydrALAZINE  1 tablet Oral TID  . lisinopril  2.5 mg Oral Daily  . pantoprazole  40 mg Oral Daily  . rivaroxaban  20 mg Oral Q supper  . sertraline  50 mg Oral Daily  . sodium chloride  3 mL Intravenous Q12H  . spironolactone  25 mg Oral Daily    Infusions: . milrinone 0.375 mcg/kg/min (05/30/14 1154)    PRN Medications: sodium chloride, acetaminophen, albuterol, nitroGLYCERIN, ondansetron (ZOFRAN) IV, sodium chloride   Assessment:  1. A/c systolic HF with EF 10-15% on milrinone 2. Mixed ischemic/NICM 3. Chest pain - coronaries ok on cath 8/15 4. Chronic AF 5. Anemia, iron deficiency - s/p Feraheme 9/13   Plan/Discussion:    He continues to c/o class IIIB symptoms. Co-ox improved  on higher dose milrinone. CVP still high. Continue IV lasix at least one more day.  He is not candidate for VAD due to noncompliance. Increase lasix to 80 IV bid.   CP not ischemic based on recent cath.    Length of Stay: 3  Arvilla Meres MD 05/30/2014, 1:36 PM  Advanced Heart Failure Team Pager [phone] (M-F; 7a - 4p)  Please contact [facility] for night-coverage after hours (4p -7a ) and weekends on [email or web address]

## 2014-05-30 NOTE — ED Provider Notes (Signed)
I have personally seen and examined the patient.  I have discussed the plan of care with the resident.  I have reviewed the documentation on PMH/FH/Soc. History.  I have reviewed the documentation of the resident and agree.   Simar Pothier W Torry Adamczak, MD 05/30/14 0718 

## 2014-05-30 NOTE — Progress Notes (Signed)
[facility]  Patient Status: Active pt with [facility] prior to this readmission  [facility] is providing the following services: [facility] for home Milrinone. [facility] HH was providing HHRN to support home Milirnone when pt was in [place].  Pt transitioned back to his own home in [place] the week of 05/16/14.  Arrangements made with [facility] to assume his care for [facility] upon his return to [facility]. [facility] team had great difficulty connecting with pt for [facility] visit in  [place].  We have been working thru his sister Hilda Lias in [place] to ship Milrinone to her home and she supports delivery to the pt and helps locate him for home RN visit. [facility] will work with the HF team and Case Manager to support safe and appropriate POC upon DC from hospital   If patient discharges after hours, please call [phone].   Sedalia Muta 05/30/2014, 10:09 AM

## 2014-05-31 LAB — CARBOXYHEMOGLOBIN
Carboxyhemoglobin: 1.5 % (ref 0.5–1.5)
METHEMOGLOBIN: 0.9 % (ref 0.0–1.5)
O2 SAT: 60.8 %
TOTAL HEMOGLOBIN: 9.1 g/dL — AB (ref 13.5–18.0)

## 2014-05-31 LAB — BASIC METABOLIC PANEL
Anion gap: 14 (ref 5–15)
BUN: 14 mg/dL (ref 6–23)
CO2: 26 meq/L (ref 19–32)
Calcium: 9 mg/dL (ref 8.4–10.5)
Chloride: 101 mEq/L (ref 96–112)
Creatinine, Ser: 1.17 mg/dL (ref 0.50–1.35)
GFR calc Af Amer: 84 mL/min — ABNORMAL LOW (ref >90)
GFR, EST NON AFRICAN AMERICAN: 72 mL/min — AB (ref >90)
GLUCOSE: 136 mg/dL — AB (ref 70–99)
POTASSIUM: 3.5 meq/L — AB (ref 3.7–5.3)
SODIUM: 141 meq/L (ref 137–147)

## 2014-05-31 LAB — GLUCOSE, CAPILLARY
Glucose-Capillary: 83 mg/dL (ref 70–99)
Glucose-Capillary: 85 mg/dL (ref 70–99)

## 2014-05-31 MED ORDER — FUROSEMIDE 40 MG PO TABS
40.0000 mg | ORAL_TABLET | Freq: Two times a day (BID) | ORAL | Status: DC
  Administered 2014-06-01: 40 mg via ORAL
  Filled 2014-05-31 (×3): qty 1

## 2014-05-31 MED ORDER — LISINOPRIL 2.5 MG PO TABS
2.5000 mg | ORAL_TABLET | Freq: Two times a day (BID) | ORAL | Status: DC
  Administered 2014-05-31 – 2014-06-01 (×2): 2.5 mg via ORAL
  Filled 2014-05-31 (×4): qty 1

## 2014-05-31 MED ORDER — FUROSEMIDE 40 MG PO TABS: 40.0000 mg | ORAL_TABLET | Freq: Two times a day (BID) | ORAL | Status: DC

## 2014-05-31 MED ORDER — POTASSIUM CHLORIDE CRYS ER 20 MEQ PO TBCR
40.0000 meq | EXTENDED_RELEASE_TABLET | Freq: Once | ORAL | Status: AC
  Administered 2014-05-31: 40 meq via ORAL
  Filled 2014-05-31: qty 2

## 2014-05-31 NOTE — Progress Notes (Addendum)
Advanced Heart Failure Rounding Note   Subjective:    48 y.o. male from [facility] with NICM s/p ICD (Biotronik), chronic systolic heart failure EF 10%, CAD s/p angioplasty x 1 vessel (2005), chronic atrial fibrillation, HTN, OSA and history of non-compliance. He was just discharged from [facility] 04/2014 with ADHF. He was started on Milrinone and diuresed 28lbs. Cath with ok coronaries.  Missed f/u clinic appt in CHF clinic. D/c weight 179.   Readmitted with CP and CHF.   Milrinone increased to 0.375 yesterday due to low co-ox. Lasix increased to 80 mg IV BID yesterday. 24 hr I/O -4.2 liters and weight down 3 lbs. Co-ox 61%. Denies SOB, orthopnea or CP. Ambulating in the halls with no issues. CVP 3-4    Objective:   Weight Range:  Vital Signs:   Temp:  [97.5 F (36.4 C)-98.5 F (36.9 C)] 98.4 F (36.9 C) (09/15 0744) Pulse Rate:  [61-101] 87 (09/15 0744) Resp:  [20-22] 20 (09/15 0744) BP: (118-126)/(63-108) 121/63 mmHg (09/15 0744) SpO2:  [98 %-100 %] 98 % (09/15 0744) Weight:  [183 lb 9.6 oz (83.28 kg)] 183 lb 9.6 oz (83.28 kg) (09/15 0300) Last BM Date: 05/29/14  Weight change: Filed Weights   05/29/14 0317 05/30/14 0400 05/31/14 0300  Weight: 191 lb (86.637 kg) 186 lb 9.6 oz (84.641 kg) 183 lb 9.6 oz (83.28 kg)    Intake/Output:   Intake/Output Summary (Last 24 hours) at 05/31/14 0908 Last data filed at 05/31/14 0800  Gross per 24 hour  Intake  698.7 ml  Output   4400 ml  Net -3701.3 ml     Physical Exam: General:  Well appearing. No resp difficulty HEENT: normal Neck: supple. JVP flat . Carotids 2+ bilat; no bruits. No lymphadenopathy or thryomegaly appreciated. Cor: PMI laterally displaced. Irregular rate & rhythm. +s3 Lungs: clear Abdomen: soft, nontender, nondistended. No hepatosplenomegaly. No bruits or masses. Good bowel sounds. Extremities: no cyanosis, clubbing, rash, edema Neuro: alert & orientedx3, cranial nerves grossly intact. moves all 4 extremities w/o  difficulty. Affect pleasant  Telemetry: Chronic AF  Labs: Basic Metabolic Panel:  Recent Labs Lab 05/27/14 2008 05/28/14 0512 05/29/14 0500 05/30/14 0409 05/31/14 0500  NA 139  --  137 140 141  K 3.7  --  3.7 3.5* 3.5*  CL 102  --  101 102 101  CO2 23  --  GLUCOSE 83  --  88 87 136*  BUN 18  --  CREATININE 1.12  --  1.10 1.18 1.17  CALCIUM 9.3  --  9.0 8.8 9.0  MG  --  1.8  --   --   --     Liver Function Tests: No results found for this basename: AST, ALT, ALKPHOS, BILITOT, PROT, ALBUMIN,  in the last 168 hours No results found for this basename: LIPASE, AMYLASE,  in the last 168 hours No results found for this basename: AMMONIA,  in the last 168 hours  CBC:  Recent Labs Lab 05/27/14 2008 05/28/14 0512  WBC 4.4 4.2  NEUTROABS  --  1.9  HGB 9.4* 9.6*  HCT 30.2* 30.8*  MCV 73.5* 72.1*  PLT 304 309    Cardiac Enzymes:  Recent Labs Lab 05/27/14 2008 05/27/14 2326 05/28/14 0512 05/28/14 1125  TROPONINI <0.30 <0.30 <0.30 <0.30    BNP: BNP (last 3 results)  Recent Labs  04/28/14 0811 05/05/14 1610 05/27/14 2008  PROBNP 4523.0* 5876.0* 3625.0*     Other results:  Imaging: No results found.   Medications:     Scheduled Medications: . atorvastatin  40 mg Oral Daily  . furosemide  80 mg Intravenous BID  . isosorbide-hydrALAZINE  1 tablet Oral TID  . lisinopril  2.5 mg Oral Daily  . pantoprazole  40 mg Oral Daily  . potassium chloride  20 mEq Oral BID  . rivaroxaban  20 mg Oral Q supper  . sertraline  50 mg Oral Daily  . sodium chloride  3 mL Intravenous Q12H  . spironolactone  25 mg Oral Daily    Infusions: . milrinone 0.375 mcg/kg/min (05/31/14 0112)    PRN Medications: sodium chloride, acetaminophen, albuterol, nitroGLYCERIN, ondansetron (ZOFRAN) IV, sodium chloride   Assessment:  1. A/c systolic HF with EF 10-15% on milrinone 2. Mixed ischemic/NICM 3. Chest pain - coronaries ok on cath 8/15 4.  Chronic AF 5. Anemia, iron deficiency - s/p Feraheme 9/13   Plan/Discussion:    Volume status improving and weight is down 3 lbs overnight. He is about 4 lbs from last discharge weight. Stop IV lasix and then transition to lasix 40 mg PO BID. Supplement K+.  Will increase his lisinopril to 2.5 mg BID. Renal function stable and will continue to follow.   Chronic afib with rate controlled. His LA and RA are severely dilated and likely would not maintain SR if we tried to cardiovert. Continue Xarelto 20 mg daily.   He is not candidate for VAD due to noncompliance and we have discussed the need to be compliant with medications, follow-up and sodium/fluid intake. Patient reports he is going to be living here now for awhile and reports that he can make it to our appointments.   Will transfer to telemetry and hopefully home tomorrow.   Length of Stay: 4  Aundria Rud NP-C 05/31/2014, 9:08 AM  Advanced Heart Failure Team Pager [phone] (M-F; 7a - 4p)  Please contact [facility] for night-coverage after hours (4p -7a ) and weekends on [email or web address]  He is improving. CVP 3-4. Stop IV lasix. Transition to po. Continue to follow CVPs and co-ox. Hopefully home tomorrow.   Shadd Dunstan,MD 10:10 AM

## 2014-05-31 NOTE — Progress Notes (Signed)
CARE MANAGEMENT NOTE 05/31/2014  Patient:  [facility]   Account Number:  [id number]  Date Initiated:  05/29/2014  Documentation initiated by:  [facility]  Subjective/Objective Assessment:   adm: chest pain and dyspnea on exertion     Action/Plan:   discharge planning   Anticipated DC Date:  06/03/2014   Anticipated DC Plan:  HOME W HOME HEALTH SERVICES      DC Planning Services  CM consult      [facility] Choice  Resumption Of Svcs/PTA Provider   Choice offered to / List presented to:          [facility] arranged  HH-1 RN  HH-10 DISEASE MANAGEMENT      HH agency  [facility]   Status of service:  Completed, signed off Medicare Important Message given?   (If response is "NO", the following Medicare IM given date fields will be blank) Date Medicare IM given:   Medicare IM given by:   Date Additional Medicare IM given:   Additional Medicare IM given by:    Discharge Disposition:  HOME W HOME HEALTH SERVICES  Per UR Regulation:    If discussed at Long Length of Stay Meetings, dates discussed:   05/31/2014    Comments:  [id number] Davis,RN,BSN,CCM: Patient continues on iv Milrinone/retitrated on [id number]/[facility] is aware of patient and possible need for resumption of iv med care at home.  05/29/14 09:00 CM called [facility] rep, Kristen to give notice pt is in hospital and may be discharged today with milrinone. Request of MD for resumption orders.  Waiting for orders-will continue to follow. Freddy Jaksch, BSN, CM [phone].

## 2014-06-01 ENCOUNTER — Encounter (HOSPITAL_COMMUNITY): Payer: Self-pay | Admitting: Anesthesiology

## 2014-06-01 DIAGNOSIS — R570 Cardiogenic shock: Secondary | ICD-10-CM

## 2014-06-01 DIAGNOSIS — I5023 Acute on chronic systolic (congestive) heart failure: Secondary | ICD-10-CM

## 2014-06-01 LAB — CARBOXYHEMOGLOBIN
CARBOXYHEMOGLOBIN: 1.9 % — AB (ref 0.5–1.5)
CARBOXYHEMOGLOBIN: 1.2 % (ref 0.5–1.5)
Carboxyhemoglobin: 1.7 % — ABNORMAL HIGH (ref 0.5–1.5)
Methemoglobin: 0.6 % (ref 0.0–1.5)
Methemoglobin: 0.9 % (ref 0.0–1.5)
Methemoglobin: 0.6 % (ref 0.0–1.5)
O2 Saturation: 86.2 %
O2 Saturation: 47.1 %
O2 Saturation: 75.9 %
TOTAL HEMOGLOBIN: 9.5 g/dL — AB (ref 13.5–18.0)
TOTAL HEMOGLOBIN: 9.9 g/dL — AB (ref 13.5–18.0)
Total hemoglobin: 9.2 g/dL — ABNORMAL LOW (ref 13.5–18.0)

## 2014-06-01 LAB — GLUCOSE, CAPILLARY: GLUCOSE-CAPILLARY: 94 mg/dL (ref 70–99)

## 2014-06-01 LAB — BASIC METABOLIC PANEL
Anion gap: 11 (ref 5–15)
BUN: 12 mg/dL (ref 6–23)
CALCIUM: 9.4 mg/dL (ref 8.4–10.5)
CHLORIDE: 100 meq/L (ref 96–112)
CO2: 25 mEq/L (ref 19–32)
CREATININE: 1.06 mg/dL (ref 0.50–1.35)
GFR calc non Af Amer: 81 mL/min — ABNORMAL LOW (ref >90)
Glucose, Bld: 137 mg/dL — ABNORMAL HIGH (ref 70–99)
Potassium: 4 mEq/L (ref 3.7–5.3)
Sodium: 136 mEq/L — ABNORMAL LOW (ref 137–147)

## 2014-06-01 LAB — CBC
HCT: 31 % — ABNORMAL LOW (ref 39.0–52.0)
Hemoglobin: 9.7 g/dL — ABNORMAL LOW (ref 13.0–17.0)
MCH: 22.6 pg — AB (ref 26.0–34.0)
MCHC: 31.3 g/dL (ref 30.0–36.0)
MCV: 72.3 fL — ABNORMAL LOW (ref 78.0–100.0)
Platelets: 288 10*3/uL (ref 150–400)
RBC: 4.29 MIL/uL (ref 4.22–5.81)
RDW: 21 % — ABNORMAL HIGH (ref 11.5–15.5)
WBC: 3.9 10*3/uL — ABNORMAL LOW (ref 4.0–10.5)

## 2014-06-01 MED ORDER — ISOSORB DINITRATE-HYDRALAZINE 20-37.5 MG PO TABS: 1.0000 | ORAL_TABLET | Freq: Three times a day (TID) | ORAL | Status: DC

## 2014-06-01 MED ORDER — ATORVASTATIN CALCIUM 40 MG PO TABS: 40.0000 mg | ORAL_TABLET | Freq: Every day | ORAL | Status: DC

## 2014-06-01 MED ORDER — MILRINONE IN DEXTROSE 20 MG/100ML IV SOLN: 0.3750 ug/kg/min | INTRAVENOUS | Status: AC

## 2014-06-01 MED ORDER — RIVAROXABAN 20 MG PO TABS: 20.0000 mg | ORAL_TABLET | Freq: Every day | ORAL | Status: DC

## 2014-06-01 MED ORDER — LISINOPRIL 2.5 MG PO TABS: 2.5000 mg | ORAL_TABLET | Freq: Two times a day (BID) | ORAL | Status: DC

## 2014-06-01 MED ORDER — FUROSEMIDE 40 MG PO TABS: 40.0000 mg | ORAL_TABLET | Freq: Two times a day (BID) | ORAL | Status: DC

## 2014-06-01 MED ORDER — SPIRONOLACTONE 25 MG PO TABS: 25.0000 mg | ORAL_TABLET | Freq: Every day | ORAL | Status: DC

## 2014-06-01 MED ORDER — HEPARIN SOD (PORK) LOCK FLUSH 100 UNIT/ML IV SOLN
250.0000 [IU] | INTRAVENOUS | Status: AC | PRN
  Administered 2014-06-01: 250 [IU]

## 2014-06-01 MED ORDER — POTASSIUM CHLORIDE ER 10 MEQ PO TBCR: 20.0000 meq | EXTENDED_RELEASE_TABLET | Freq: Two times a day (BID) | ORAL | Status: DC

## 2014-06-01 NOTE — Discharge Summary (Signed)
Advanced Heart Failure Team  Discharge Summary   Patient ID: Tim Reyes MRN: [id number], DOB/AGE: September 20, 1965 48 y.o. Admit date: 05/27/2014 D/C date:     06/01/2014   Primary Discharge Diagnoses:  1) Cardiogenic shock 2) A/C systolic HF - ECHO (04/2014): EF 15%, aortic root dilated, mod/severe MR, RV mildly dilated and fx reduced, severe TR, RA massively dilated - Net negative diuresis: -11.1 liters and discharge weight 181 lbs.  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  He was recently admitted to [facility] and sent home on milrinone in August 2015, however patient did not follow up and ran out of medications. He presented to the ED on 9/12 with CP and SOB. On admission he was volume overloaded and had cardiogenic shock with a co-ox of 47%. His milrinone was increased to 0.375 mcg and started on IV lasix with great diuresis. He is net negative -11.1 liters and weight on day of discharge was 181 lbs. He was continued on spironolactone, lisinopril and Bidil, however his digoxin was stopped d/t non-compliance and his BB was stopped with acute decompensation. Cardiac rehab was consulted and he ambulated in the halls with no issues or SOB. Multiple lengthy discussions took place with patient about the need to follow up and that if he does not show up for his clinic appointments that milrinone will be discontinued.  On days of discharge his VSS and denied any CP, SOB or orthopnea. He will be followed by [facility] RN and will have  follow up next week in the HF clinic.   Discharge Weight Range: 181-184 lbs Discharge Vitals: Blood pressure 112/69, pulse 92, temperature 98.2 F (36.8 C), temperature source Oral, resp. rate 19, height  (1.778 m), weight 181 lb 7 oz (82.3 kg), SpO2 99.00%.  Labs: Lab Results  Component Value Date   WBC 3.9* 06/01/2014   HGB 9.7* 06/01/2014   HCT 31.0* 06/01/2014   MCV 72.3* 06/01/2014   PLT 288 06/01/2014     Recent Labs Lab 06/01/14 0830  NA 136*  K 4.0  CL 100  CO2 25  BUN 12  CREATININE 1.06  CALCIUM 9.4  GLUCOSE 137*   No results found for this basename: CHOL,  HDL,  LDLCALC,  TRIG   BNP (last 3 results)  Recent Labs  04/28/14 0811 05/05/14 1610 05/27/14 2008  PROBNP 4523.0* 5876.0* 3625.0*    Diagnostic Studies/Procedures   No results found.  Discharge Medications     Medication List    STOP taking these medications       carvedilol 3.125 MG tablet  Commonly known as:  COREG     digoxin 0.125 MG tablet  Commonly known as:  LANOXIN     magnesium oxide 400 MG tablet  Commonly known as:  MAG-OX      TAKE these medications       albuterol (2.5 MG/3ML) 0.083% nebulizer solution  Commonly known as:  PROVENTIL  Take 3 mLs (2.5 mg total) by nebulization every 6 (six) hours as needed  for wheezing or shortness of breath.     atorvastatin 40 MG tablet  Commonly known as:  LIPITOR  Take 1 tablet (40 mg total) by mouth daily.     furosemide 40 MG tablet  Commonly known as:  LASIX  Take 1 tablet (40 mg total) by mouth 2 (two) times daily.     isosorbide-hydrALAZINE 20-37.5 MG per tablet  Commonly known as:  BIDIL  Take 1 tablet by mouth 3 (three) times daily.     lisinopril 2.5 MG tablet  Commonly known as:  PRINIVIL,ZESTRIL  Take 1 tablet (2.5 mg total) by mouth 2 (two) times daily.     milrinone 20 MG/100ML Soln infusion  Commonly known as:  PRIMACOR  Inject 32.475 mcg/min into the vein continuous.     omeprazole 20 MG capsule   Commonly known as:  PRILOSEC  Take 1 capsule (20 mg total) by mouth 2 (two) times daily before a meal.     potassium chloride 10 MEQ tablet  Commonly known as:  K-DUR  Take 2 tablets (20 mEq total) by mouth 2 (two) times daily.     rivaroxaban 20 MG Tabs tablet  Commonly known as:  XARELTO  Take 1 tablet (20 mg total) by mouth daily with supper.     sertraline 50 MG tablet  Commonly known as:  ZOLOFT  Take 1 tablet (50 mg total) by mouth daily.     spironolactone 25 MG tablet  Commonly known as:  ALDACTONE  Take 1 tablet (25 mg total) by mouth daily.        Disposition   The patient will be discharged in stable condition to home. Discharge Instructions   ACE Inhibitor / ARB already ordered    Complete by:  As directed      Continue PICC at discharge    Complete by:  As directed   Flush PICC line per home health policy:  Yes     Contraindication beta blocker-discharge    Complete by:  As directed      Diet - low sodium heart healthy    Complete by:  As directed      Discharge instructions    Complete by:  As directed   1) Bring all your medications to your clinic appointments  Call any issues [phone]     Face-to-face encounter (required for Medicare/Medicaid patients)    Complete by:  As directed   I Ulla Potash B certify that this patient is under my care and that I, or a nurse practitioner or physician's assistant working with me, had a face-to-face encounter that meets the physician face-to-face encounter requirements with this patient on 06/01/2014. The encounter with the patient was in whole, or in part for the following medical condition(s) which is the primary reason for home health care (List medical condition): chronic systolic HF  The encounter with the patient was in whole, or in part, for the following medical condition, which is the primary reason for home health care:  chronic systolic HF  I certify that, based on my findings, the following services are  medically necessary home health services:  Nursing  My clinical findings support the need for the above services:  Shortness of breath with activity  Further, I certify that my clinical findings support that this patient is homebound due to:  Ambulates short distances less than 300 feet  Reason for Medically Necessary Home Health Services:  Skilled Nursing- Change/Decline in Patient Status     Heart  Failure patients record your daily weight using the same scale at the same time of day    Complete by:  As directed      Heart failure home health orders    Complete by:  As directed   Heart Failure Follow-up Care:  Verify follow-up appointments per Patient Discharge Instructions. Confirm transportation arranged. Reconcile home medications with discharge medication list. Remove discontinued medications from use. Assist patient/caregiver to manage medications using pill box. Reinforce low sodium food selection Assessments: Vital signs and oxygen saturation at each visit. Assess home environment for safety concerns, caregiver support and availability of low-sodium foods. Consult Child psychotherapist, PT/OT, Dietitian, and CNA based on assessments. Perform comprehensive cardiopulmonary assessment. Notify MD for any change in condition or weight gain of 3 pounds in one day or 5 pounds in one week with symptoms. Daily Weights and Symptom Monitoring: Ensure patient has access to scales. Teach patient/caregiver to weigh daily before breakfast and after voiding using same scale and record.    Teach patient/caregiver to track weight and symptoms and when to notify Provider. Activity: Develop individualized activity plan with patient/caregiver.  Home with milrinone 0.375 mcg  Heart Failure Follow-up Care:  [facility] ([facility]F) [facility] at [phone]  Obtain the following labs:  Basic Metabolic Panel  Lab frequency:  Weekly  Fax lab results to:  AHF Clinic at [phone]  Diet:  Low Sodium Heart Healthy   Fluid restrictions:  2000 mL Fluid  Initiate Heart Failure Clinic Diuretic Protocol to be used by [facility] only ( to be ordered by Heart Failure Team Providers Only):  Yes     Increase activity slowly    Complete by:  As directed      STOP any activity that causes chest pain, shortness of breath, dizziness, sweating, or exessive weakness    Complete by:  As directed           Follow-up Information   Follow up with [facility].   Contact information:   [address] [place] [address] [phone]       Follow up with [facility] On 06/08/2014. (@ 10:45 am; Please bring all your medications to your visit. Clinic is located in the hospital. )    Specialty:  Cardiology   Contact information:   [address] [id number] [place] [address] [phone]        Duration of Discharge Encounter: Greater than 35 minutes   Signed, Aundria Rud  06/01/2014, 10:55 AM  Patient seen and examined with Ulla Potash, NP. We discussed all aspects of the encounter. I agree with the assessment and plan as stated above.   He is stable for discharge. Long talk about need for close f/u and compliance. Told him that if he misses CHF appts we will have to d/c milrinone as I do not feel comfortable having him on home inotropes unless he can be followed closely.   Daniel Bensimhon,MD 5:52 PM

## 2014-06-01 NOTE — Progress Notes (Signed)
Advanced Heart Failure Rounding Note   Subjective:    48 y.o. male from [facility] with NICM s/p ICD (Biotronik), chronic systolic heart failure EF 10%, CAD s/p angioplasty x 1 vessel (2005), chronic atrial fibrillation, HTN, OSA and history of non-compliance. He was just discharged from [facility] 04/2014 with ADHF. He was started on Milrinone and diuresed 28lbs. Cath with ok coronaries.  Missed f/u clinic appt in CHF clinic. D/c weight 179.   Readmitted with CP and CHF.   Milrinone at 0.375 mcg and co-ox low this am, 47% (repeating). Switched to PO lasix yesterday. Weight down 2 lbs, 24 hr I/O -1.3 liters. Renal function stable.  Denies SOB, orthopnea or CP. Ambulating in the halls with no issues.     Objective:   Weight Range:  Vital Signs:   Temp:  [97 F (36.1 C)-98.3 F (36.8 C)] 98.2 F (36.8 C) (09/16 0801) Pulse Rate:  [85-92] 92 (09/16 0801) Resp:  [18-19] 19 (09/15 1707) BP: (112-134)/(69-86) 112/69 mmHg (09/16 0801) SpO2:  [97 %-100 %] 99 % (09/16 0801) Weight:  [181 lb 7 oz (82.3 kg)] 181 lb 7 oz (82.3 kg) (09/16 0350) Last BM Date: 05/30/14  Weight change: Filed Weights   05/30/14 0400 05/31/14 0300 06/01/14 0350  Weight: 186 lb 9.6 oz (84.641 kg) 183 lb 9.6 oz (83.28 kg) 181 lb 7 oz (82.3 kg)    Intake/Output:   Intake/Output Summary (Last 24 hours) at 06/01/14 0946 Last data filed at 06/01/14 0600  Gross per 24 hour  Intake  608.7 ml  Output   1100 ml  Net -491.3 ml     Physical Exam: General:  Well appearing. No resp difficulty HEENT: normal Neck: supple. JVP flat . Carotids 2+ bilat; no bruits. No lymphadenopathy or thryomegaly appreciated. Cor: PMI laterally displaced. Irregular rate & rhythm. +s3 Lungs: clear Abdomen: soft, nontender, nondistended. No hepatosplenomegaly. No bruits or masses. Good bowel sounds. Extremities: no cyanosis, clubbing, rash, edema Neuro: alert & orientedx3, cranial nerves grossly intact. moves all 4 extremities w/o difficulty.  Affect pleasant  Telemetry: Chronic AF  Labs: Basic Metabolic Panel:  Recent Labs Lab 05/27/14 2008 05/28/14 0512 05/29/14 0500 05/30/14 0409 05/31/14 0500 06/01/14 0830  NA 139  --  137 140 141 136*  K 3.7  --  3.7 3.5* 3.5* 4.0  CL 102  --  101 102 101 100  CO2 23  --  GLUCOSE 83  --  88 87 136* 137*  BUN 18  --  CREATININE 1.12  --  1.10 1.18 1.17 1.06  CALCIUM 9.3  --  9.0 8.8 9.0 9.4  MG  --  1.8  --   --   --   --     Liver Function Tests: No results found for this basename: AST, ALT, ALKPHOS, BILITOT, PROT, ALBUMIN,  in the last 168 hours No results found for this basename: LIPASE, AMYLASE,  in the last 168 hours No results found for this basename: AMMONIA,  in the last 168 hours  CBC:  Recent Labs Lab 05/27/14 2008 05/28/14 0512 06/01/14 0830  WBC 4.4 4.2 3.9*  NEUTROABS  --  1.9  --   HGB 9.4* 9.6* 9.7*  HCT 30.2* 30.8* 31.0*  MCV 73.5* 72.1* 72.3*  PLT 304 309 288    Cardiac Enzymes:  Recent Labs Lab 05/27/14 2008 05/27/14 2326 05/28/14 0512 05/28/14 1125  TROPONINI <0.30 <0.30 <0.30 <0.30    BNP: BNP (  last 3 results)  Recent Labs  04/28/14 0811 05/05/14 1610 05/27/14 2008  PROBNP 4523.0* 5876.0* 3625.0*     Other results:    Imaging: No results found.   Medications:     Scheduled Medications: . atorvastatin  40 mg Oral Daily  . furosemide  40 mg Oral BID  . isosorbide-hydrALAZINE  1 tablet Oral TID  . lisinopril  2.5 mg Oral BID  . pantoprazole  40 mg Oral Daily  . potassium chloride  20 mEq Oral BID  . rivaroxaban  20 mg Oral Q supper  . sertraline  50 mg Oral Daily  . sodium chloride  3 mL Intravenous Q12H  . spironolactone  25 mg Oral Daily    Infusions: . milrinone 0.375 mcg/kg/min (05/31/14 2353)    PRN Medications: sodium chloride, acetaminophen, albuterol, nitroGLYCERIN, ondansetron (ZOFRAN) IV   Assessment:  1. A/c systolic HF with EF 10-15% on milrinone 2. Mixed  ischemic/NICM 3. Chest pain - coronaries ok on cath 8/15 4. Chronic AF 5. Anemia, iron deficiency - s/p Feraheme 9/13   Plan/Discussion:    His volume status is good and is now on PO lasix 40 mg BID. Renal function stable. SBP 115-120s will continue current medications. Remains on milrinone 0.375 mcg, however co-ox down this am will repeat. Ambulating in the halls with no SOB.   Chronic afib with rate controlled. His LA and RA are severely dilated and likely would not maintain SR if we tried to cardiovert. Continue Xarelto 20 mg daily.   He is not candidate for VAD due to noncompliance and we have discussed the need to be compliant with medications, follow-up and sodium/fluid intake. Patient reports he is going to be living here now for awhile and reports that he can make it to our appointments.   If co-ox stable will send home today with close follow up in HF clinic next week.   Length of Stay: 5  Aundria Rud NP-C 06/01/2014, 9:46 AM  Advanced Heart Failure Team Pager [phone] (M-F; 7a - 4p)  Please contact [facility] for night-coverage after hours (4p -7a ) and weekends on [email or web address]   Patient seen and examined with Ulla Potash, NP. We discussed all aspects of the encounter. I agree with the assessment and plan as stated above.   He is stable for discharge. Long talk about need for close f/u and compliance. Told him that if he misses CHF appts we will have to d/c milrinone as I do not feel comfortable having him on home inotropes unless he can be followed closely.   Daniel Bensimhon,MD 5:52 PM

## 2014-06-01 NOTE — Progress Notes (Signed)
Discharged to home with belongings. Pt education provided. Discharge summary with instructions given. Pt verbalized understanding. Pt discharged with home milrinone; [facility] to follow up.

## 2014-06-02 DIAGNOSIS — I509 Heart failure, unspecified: Secondary | ICD-10-CM

## 2014-06-02 DIAGNOSIS — Z452 Encounter for adjustment and management of vascular access device: Secondary | ICD-10-CM

## 2014-06-02 DIAGNOSIS — R570 Cardiogenic shock: Secondary | ICD-10-CM

## 2014-06-02 DIAGNOSIS — I5023 Acute on chronic systolic (congestive) heart failure: Secondary | ICD-10-CM

## 2014-06-06 ENCOUNTER — Emergency Department (HOSPITAL_COMMUNITY): Payer: Medicare Other

## 2014-06-06 ENCOUNTER — Telehealth (HOSPITAL_COMMUNITY): Payer: Self-pay | Admitting: Cardiology

## 2014-06-06 ENCOUNTER — Encounter (HOSPITAL_COMMUNITY): Payer: Self-pay | Admitting: Emergency Medicine

## 2014-06-06 ENCOUNTER — Telehealth (HOSPITAL_COMMUNITY): Payer: Self-pay | Admitting: Vascular Surgery

## 2014-06-06 ENCOUNTER — Emergency Department (HOSPITAL_COMMUNITY)
Admission: EM | Admit: 2014-06-06 | Discharge: 2014-06-06 | Disposition: A | Discharge status: Home / Self Care | Payer: Medicare Other | Attending: Emergency Medicine | Admitting: Emergency Medicine

## 2014-06-06 DIAGNOSIS — I5022 Chronic systolic (congestive) heart failure: Secondary | ICD-10-CM | POA: Diagnosis not present

## 2014-06-06 DIAGNOSIS — R791 Abnormal coagulation profile: Secondary | ICD-10-CM | POA: Diagnosis not present

## 2014-06-06 DIAGNOSIS — Z7901 Long term (current) use of anticoagulants: Secondary | ICD-10-CM | POA: Diagnosis not present

## 2014-06-06 DIAGNOSIS — R079 Chest pain, unspecified: Secondary | ICD-10-CM | POA: Insufficient documentation

## 2014-06-06 DIAGNOSIS — Z8719 Personal history of other diseases of the digestive system: Secondary | ICD-10-CM | POA: Insufficient documentation

## 2014-06-06 DIAGNOSIS — R0602 Shortness of breath: Secondary | ICD-10-CM | POA: Insufficient documentation

## 2014-06-06 DIAGNOSIS — I4891 Unspecified atrial fibrillation: Secondary | ICD-10-CM | POA: Insufficient documentation

## 2014-06-06 DIAGNOSIS — F17200 Nicotine dependence, unspecified, uncomplicated: Secondary | ICD-10-CM | POA: Insufficient documentation

## 2014-06-06 DIAGNOSIS — R609 Edema, unspecified: Secondary | ICD-10-CM | POA: Insufficient documentation

## 2014-06-06 DIAGNOSIS — I252 Old myocardial infarction: Secondary | ICD-10-CM | POA: Insufficient documentation

## 2014-06-06 DIAGNOSIS — I2510 Atherosclerotic heart disease of native coronary artery without angina pectoris: Secondary | ICD-10-CM | POA: Diagnosis not present

## 2014-06-06 DIAGNOSIS — Z862 Personal history of diseases of the blood and blood-forming organs and certain disorders involving the immune mechanism: Secondary | ICD-10-CM | POA: Insufficient documentation

## 2014-06-06 DIAGNOSIS — Z95810 Presence of automatic (implantable) cardiac defibrillator: Secondary | ICD-10-CM | POA: Insufficient documentation

## 2014-06-06 DIAGNOSIS — I10 Essential (primary) hypertension: Secondary | ICD-10-CM | POA: Diagnosis not present

## 2014-06-06 DIAGNOSIS — Z79899 Other long term (current) drug therapy: Secondary | ICD-10-CM | POA: Insufficient documentation

## 2014-06-06 DIAGNOSIS — R7989 Other specified abnormal findings of blood chemistry: Secondary | ICD-10-CM

## 2014-06-06 LAB — BASIC METABOLIC PANEL
Anion gap: 14 (ref 5–15)
BUN: 14 mg/dL (ref 6–23)
CHLORIDE: 103 meq/L (ref 96–112)
CO2: 26 mEq/L (ref 19–32)
CREATININE: 1.09 mg/dL (ref 0.50–1.35)
Calcium: 9.4 mg/dL (ref 8.4–10.5)
GFR, EST NON AFRICAN AMERICAN: 79 mL/min — AB (ref >90)
GLUCOSE: 84 mg/dL (ref 70–99)
Potassium: 3.8 mEq/L (ref 3.7–5.3)
Sodium: 143 mEq/L (ref 137–147)

## 2014-06-06 LAB — CBC
HCT: 37.4 % — ABNORMAL LOW (ref 39.0–52.0)
Hemoglobin: 11.5 g/dL — ABNORMAL LOW (ref 13.0–17.0)
MCH: 23.8 pg — AB (ref 26.0–34.0)
MCHC: 30.7 g/dL (ref 30.0–36.0)
MCV: 77.3 fL — AB (ref 78.0–100.0)
Platelets: 289 10*3/uL (ref 150–400)
RBC: 4.84 MIL/uL (ref 4.22–5.81)
RDW: 24.1 % — AB (ref 11.5–15.5)
WBC: 4.8 10*3/uL (ref 4.0–10.5)

## 2014-06-06 LAB — I-STAT TROPONIN, ED
TROPONIN I, POC: 0.02 ng/mL (ref 0.00–0.08)
Troponin i, poc: 0.02 ng/mL (ref 0.00–0.08)

## 2014-06-06 LAB — PRO B NATRIURETIC PEPTIDE: Pro B Natriuretic peptide (BNP): 1955 pg/mL — ABNORMAL HIGH (ref 0–125)

## 2014-06-06 MED ORDER — OXYCODONE-ACETAMINOPHEN 5-325 MG PO TABS
1.0000 | ORAL_TABLET | Freq: Once | ORAL | Status: AC
  Administered 2014-06-06: 1 via ORAL
  Filled 2014-06-06: qty 1

## 2014-06-06 MED ORDER — ONDANSETRON 4 MG PO TBDP
8.0000 mg | ORAL_TABLET | Freq: Once | ORAL | Status: AC
  Administered 2014-06-06: 8 mg via ORAL
  Filled 2014-06-06: qty 2

## 2014-06-06 NOTE — Telephone Encounter (Signed)
PT ACTIVE CHEST PAIN

## 2014-06-06 NOTE — Telephone Encounter (Signed)
Judeth Cornfield called during [facility] visit Pt is c/o active chest pains that radiate into L arm  Advised pt should report to ER for further evaluation

## 2014-06-06 NOTE — Discharge Instructions (Signed)

## 2014-06-06 NOTE — ED Notes (Signed)
Lab at BS 

## 2014-06-06 NOTE — ED Notes (Signed)
Pt in xray

## 2014-06-06 NOTE — Telephone Encounter (Signed)
As reported in previous message pt advised to report to ER for further evaluation

## 2014-06-06 NOTE — ED Provider Notes (Signed)
CSN: [id number]     Arrival date & time 06/06/14  1614 History   First MD Initiated Contact with Patient 06/06/14 1751     Chief Complaint  Patient presents with  . Chest Pain     Patient is a 48 y.o. male presenting with chest pain. The history is provided by the patient.  Chest Pain Pain location:  L lateral chest Pain quality: pressure   Radiates to: R chest. Pain radiates to the back: no   Pain severity now: 10/10. Onset quality:  Gradual Duration:  2 hours Timing:  Constant Progression:  Unchanged Chronicity:  Recurrent Context: at rest   Context: not breathing, no movement, not raising an arm and no trauma   Associated symptoms: cough, lower extremity edema (much improved), orthopnea (chronic) and shortness of breath (chronic)   Associated symptoms: no abdominal pain, no back pain, no diaphoresis, no fever, no nausea and not vomiting     Discharged from the hospital last week for decompensated CHF with cardiogenic shock.  Discharged with milrinone and dig/BB were discontinued.  Discharge weight was 181 lbs.  ECHO with EF 15%   ECHO (04/2014): EF 15%, aortic root dilated, mod/severe MR, RV mildly dilated and fx reduced, severe TR, RA massively dilated  Past Medical History  Diagnosis Date  . Essential hypertension, benign   . MI (myocardial infarction)   . ICD (implantable cardioverter-defibrillator) in place   . Chronic atrial fibrillation   . Cardiomyopathy     a) NICM/ICM  b) LHC (04/2014): 1. Mild to moderate non-obstructive CAD  . Sleep apnea   . Coronary atherosclerosis of native coronary artery     PCTA 2005 - single vessel, reportedly patent 2012  . Noncompliance   . Cirrhosis   . Chronic systolic heart failure     a) NICM/ICM b) RHC (04/2014): RA: 17, RV: 55/6/15, PA: 54/27 (38), PCWP: 20,  Fick CO/CI: 4.2 /2.0, PVR: 4.3 WU, PA sat: 46%, 46%  . Iron deficiency anemia    Past Surgical History  Procedure Laterality Date  . Tonsillectomy    . Ep implantable  device     Family History  Problem Relation Age of Onset  . Heart disease Father   . Heart disease Sister   . Heart disease Mother    History  Substance Use Topics  . Smoking status: Current Every Day Smoker -- 0.50 packs/day for 15 years    Types: Cigarettes  . Smokeless tobacco: Not on file  . Alcohol Use: Yes     Comment: occasional    Review of Systems  Constitutional: Negative for fever and diaphoresis.  Respiratory: Positive for cough and shortness of breath (chronic).   Cardiovascular: Positive for chest pain and orthopnea (chronic).  Gastrointestinal: Negative for nausea, vomiting and abdominal pain.  Musculoskeletal: Negative for back pain.      Allergies  Vicodin and Tramadol  Home Medications   Prior to Admission medications   Medication Sig Start Date End Date Taking? Authorizing Provider  albuterol (PROVENTIL) (2.5 MG/3ML) 0.083% nebulizer solution Take 3 mLs (2.5 mg total) by nebulization every 6 (six) hours as needed for wheezing or shortness of breath. 04/30/14   Osvaldo Shipper, MD  atorvastatin (LIPITOR) 40 MG tablet Take 1 tablet (40 mg total) by mouth daily. 06/01/14   Aundria Rud, NP  furosemide (LASIX) 40 MG tablet Take 1 tablet (40 mg total) by mouth 2 (two) times daily. 06/01/14   Aundria Rud, NP  isosorbide-hydrALAZINE (BIDIL) 20-37.5  MG per tablet Take 1 tablet by mouth 3 (three) times daily. 06/01/14   Aundria Rud, NP  lisinopril (PRINIVIL,ZESTRIL) 2.5 MG tablet Take 1 tablet (2.5 mg total) by mouth 2 (two) times daily. 06/01/14   Aundria Rud, NP  milrinone [facility]) 20 MG/100ML SOLN infusion Inject 32.475 mcg/min into the vein continuous. 06/01/14   Aundria Rud, NP  omeprazole (PRILOSEC) 20 MG capsule Take 1 capsule (20 mg total) by mouth 2 (two) times daily before a meal. 04/30/14   Osvaldo Shipper, MD  potassium chloride (K-DUR) 10 MEQ tablet Take 2 tablets (20 mEq total) by mouth 2 (two) times daily. 06/01/14   Aundria Rud, NP   rivaroxaban (XARELTO) 20 MG TABS tablet Take 1 tablet (20 mg total) by mouth daily with supper. 06/01/14   Aundria Rud, NP  sertraline (ZOLOFT) 50 MG tablet Take 1 tablet (50 mg total) by mouth daily. 04/30/14   Osvaldo Shipper, MD  spironolactone (ALDACTONE) 25 MG tablet Take 1 tablet (25 mg total) by mouth daily. 06/01/14   Aundria Rud, NP   BP 95/71  Pulse 84  Temp(Src) 98.3 F (36.8 C) (Oral)  Resp 16  SpO2 98% Physical Exam  Nursing note and vitals reviewed. Constitutional: He is oriented to person, place, and time. He appears well-developed and well-nourished. No distress.  HENT:  Head: Normocephalic and atraumatic.  Nose: Nose normal.  Eyes: Conjunctivae are normal.  Neck: Normal range of motion. Neck supple. JVD (3-4cm above clavicular notch) present. No tracheal deviation present.  Cardiovascular: Normal rate and regular rhythm.   No murmur heard. S3  Pulmonary/Chest: Effort normal and breath sounds normal. No respiratory distress. He has no wheezes. He has no rales. He exhibits no tenderness.  Abdominal: Soft. Bowel sounds are normal. He exhibits no distension and no mass. There is no tenderness.  Musculoskeletal: Normal range of motion. He exhibits edema (1+ bilateral to lower calves). He exhibits no tenderness.  No calf tenderness, warmth, erythema or palpable cords  Neurological: He is alert and oriented to person, place, and time.  Skin: Skin is warm and dry. No rash noted.  Psychiatric: He has a normal mood and affect.    ED Course  Procedures (including critical care time) Labs Review Labs Reviewed  CBC - Abnormal; Notable for the following:    Hemoglobin 11.5 (*)    HCT 37.4 (*)    MCV 77.3 (*)    MCH 23.8 (*)    RDW 24.1 (*)    All other components within normal limits  PRO B NATRIURETIC PEPTIDE - Abnormal; Notable for the following:    Pro B Natriuretic peptide (BNP) 1955.0 (*)    All other components within normal limits  BASIC METABOLIC PANEL -  Abnormal; Notable for the following:    GFR calc non Af Amer 79 (*)    All other components within normal limits  I-STAT TROPOININ, ED  Rosezena Sensor, ED    Imaging Review Dg Chest 2 View  06/06/2014   CLINICAL DATA:  Left-sided chest pain, shortness of breath  EXAM: CHEST  2 VIEW  COMPARISON:  05/27/2014  FINDINGS: Lungs are clear.  No pleural effusion or pneumothorax.  Cardiomegaly.  Left subclavian ICD.  IMPRESSION: No evidence of acute cardiopulmonary disease.   Electronically Signed   By: Charline Bills M.D.   On: 06/06/2014 18:28     EKG Interpretation None      MDM   Final diagnoses:  Chest pain,  unspecified chest pain type  Elevated brain natriuretic peptide (BNP) level    PMH of chronic systolic heart failure,s/p Biotronik ICD/pacemaker in 2011, HLD, ischemic cardiomyopathy, CAD (coronary artery disease) 08/14/04 Angioplasty x 1 vessel 11/05, cardiac catheterization Feb 2012 20% LCx & RCA, chronic atrial fibrillation, alcohol and marijuana abuse, cirrhosis  Well appearing male.  With atypical chest pain on left alteral chest. No systemic features. VSS.  LE edema much improved compared to previous exam last week.  JVD persistent.  CXR without fulminant pulmonary edema.  Weight decreased from prior admission.   On Xarelto, doubt PE.   8:34 PM Spoke with cardiologist on call. Felt chest pain was atypical in setting of NICM, plan to delta trop and send home with close follow up with cardiologist.    11:32 PM chest pain free. Delta trop negative. VSS. Stable for discharge home. Pt has appt with cardiologist in 2 days previously arranged.    Sofie Rower, MD 06/06/14 [phone]

## 2014-06-06 NOTE — ED Notes (Signed)
Pt alert, NAD, calm, interactive, resps e/u, speaking in clear complete sentences, VSS.  C/o CP/pressure, 10/10, no dyspnea noted, denies other sx.

## 2014-06-06 NOTE — ED Notes (Signed)
Pt at xray

## 2014-06-06 NOTE — ED Notes (Addendum)
Pt reports left sided chest pain that started this AM, states its worse with movement, palpation and when he takes a deep breath. Pt given 324 aspirin and 2 nitro with no relief. Pt reports continued 10/10 pain. Denies N/V, diaphoresis. Hx: pacemaker, MI, pt has PICC in place receiving primacor.

## 2014-06-06 NOTE — ED Notes (Signed)
Dr. Edman Circle in to update pt about d/c home.

## 2014-06-06 NOTE — ED Notes (Addendum)
No changes, watching TV, no dyspnea noted, alert, NAD, calm, interactive, pending 2200 lab draw. Given sandwich, snack and soda.

## 2014-06-07 NOTE — Progress Notes (Signed)
Patient ID: Tim Reyes, male   DOB: 02/16/1966, 48 y.o.   MRN: [id number] PCP: N/A Primary Cardiologist: Dr Lahoma Rocker [facility])   HPI: Tim Reyes is a 48 yo male from [place] with a history of NICM/ICM s/p ICD (Biotronik), chronic systolic HF, CAD s/p angioplasty x 1 vessel (2005), chronic atrial fibrillation, HTN, OSA, mod/sev MR, severe TR and non-compliance.  He has been admitted 4 times in the past 6 months for A/C HF. Patient had RHC showing low output and was started on milrinone. Medications titrated and diuresed and discharge weight was 181 lbs (06/01/14)  Post Hospital Follow up for Heart Failure: Since discharge has already been to the ED once for CP which improved with pain medications. Doing well. Denies SOB, orthopnea, PND or CP. Taking medications as prescribed but forgot to bring them. Just got a scale yesterday with [facility]. Following a low salt diet and drinking less than 2L a day.   ROS: All systems negative except as listed in HPI, PMH and Problem List.  SH:  History   Social History  . Marital Status: Single    Spouse Name: N/A    Number of Children: N/A  . Years of Education: N/A   Occupational History  . Not on file.   Social History Main Topics  . Smoking status: Current Every Day Smoker -- 0.50 packs/day for 15 years    Types: Cigarettes  . Smokeless tobacco: Not on file  . Alcohol Use: Yes     Comment: occasional  . Drug Use: 7.00 per week    Special: Marijuana  . Sexual Activity: Not on file   Other Topics Concern  . Not on file   Social History Narrative  . No narrative on file    FH:  Family History  Problem Relation Age of Onset  . Heart disease Father   . Heart disease Sister   . Heart disease Mother     Past Medical History  Diagnosis Date  . Essential hypertension, benign   . MI (myocardial infarction)   . ICD (implantable cardioverter-defibrillator) in place   . Chronic atrial fibrillation   .  Cardiomyopathy     a) NICM/ICM  b) LHC (04/2014): 1. Mild to moderate non-obstructive CAD  . Sleep apnea   . Coronary atherosclerosis of native coronary artery     PCTA 2005 - single vessel, reportedly patent 2012  . Noncompliance   . Cirrhosis   . Chronic systolic heart failure     a) NICM/ICM b) RHC (04/2014): RA: 17, RV: 55/6/15, PA: 54/27 (38), PCWP: 20,  Fick CO/CI: 4.2 /2.0, PVR: 4.3 WU, PA sat: 46%, 46%  . Iron deficiency anemia     Current Outpatient Prescriptions  Medication Sig Dispense Refill  . albuterol (PROVENTIL) (2.5 MG/3ML) 0.083% nebulizer solution Take 3 mLs (2.5 mg total) by nebulization every 6 (six) hours as needed for wheezing or shortness of breath.  75 mL  2  . atorvastatin (LIPITOR) 40 MG tablet Take 1 tablet (40 mg total) by mouth daily.  30 tablet  3  . furosemide (LASIX) 40 MG tablet Take 1 tablet (40 mg total) by mouth 2 (two) times daily.  60 tablet  3  . isosorbide-hydrALAZINE (BIDIL) 20-37.5 MG per tablet Take 1 tablet by mouth 3 (three) times daily.  90 tablet  6  . lisinopril (PRINIVIL,ZESTRIL) 2.5 MG tablet Take 1 tablet (2.5 mg total) by mouth 2 (two) times daily.  60 tablet  3  .  milrinone (PRIMACOR) 20 MG/100ML SOLN infusion Inject 32.475 mcg/min into the vein continuous.  100 mL  2  . omeprazole (PRILOSEC) 20 MG capsule Take 1 capsule (20 mg total) by mouth 2 (two) times daily before a meal.  60 capsule  0  . potassium chloride (K-DUR) 10 MEQ tablet Take 2 tablets (20 mEq total) by mouth 2 (two) times daily.  60 tablet  3  . rivaroxaban (XARELTO) 20 MG TABS tablet Take 1 tablet (20 mg total) by mouth daily with supper.  30 tablet  3  . sertraline (ZOLOFT) 50 MG tablet Take 1 tablet (50 mg total) by mouth daily.  30 tablet  0  . spironolactone (ALDACTONE) 25 MG tablet Take 1 tablet (25 mg total) by mouth daily.  30 tablet  3   No current facility-administered medications for this encounter.    Filed Vitals:   06/08/14 1100  BP: 107/63  Pulse: 76   Resp: 18  Weight: 184 lb 8 oz (83.689 kg)  SpO2: 100%   EKG: Atrial fibrillation with PVCs, 80 bpm  PHYSICAL EXAM: General:  Well appearing. No resp difficulty HEENT: normal Neck: supple. JVP flat. Carotids 2+ bilaterally; no bruits. No lymphadenopathy or thryomegaly appreciated. Cor: PMI normal. Regular rate & irregular rhythm. No rubs, gallops or murmurs. Lungs: clear Abdomen: soft, nontender, nondistended. No hepatosplenomegaly. No bruits or masses. Good bowel sounds. Extremities: no cyanosis, clubbing, rash, edema, R arm 2L PICC Neuro: alert & orientedx3, cranial nerves grossly intact. Moves all 4 extremities w/o difficulty. Affect pleasant.   ASSESSMENT & PLAN:  1) Chronic systolic heart failure: Mixed NICM/ICM s/p ICD Biotronik; EF 10-15% (04/2014) - Patient recently discharged from the hospital for A/C heart failure with low output. He was sent home with milrinone thru a PICC. - NYHA II symptoms on milrinone and volume status stable. Will continue lasix 40 mg daily.  - Patient did not bring medications and is not sure exactly what he is taking. He reports he is taking everything that he was discharged on. Have asked him to please bring all of his medications with him next time. Will not make any changes currently.  - Getting weekly labs with [facility].  - Reinforced the need and importance of daily weights, a low sodium diet, and fluid restriction (less than 2 L a day). Instructed to call the HF clinic if weight increases more than 3 lbs overnight or 5 lbs in a week.  2) CAD: - LHC (04/2014) mild to moderate non-obstructive CAD - no s/s of ischemia. Continue ASA, statin and ACE-I (what he was discharge on) 3) Mod/severe MR - Functional related to massively dilated LV - Volume status stable.  4) Non-compliance - Patient has contract with [facility] for his milrinone and has to follow up with our team and be available for [facility] visits in order to continue milrinone. Patient reports he wants to  move to [place] now, however I told him in order to move with milrinone he needs to find a cardiologist that is willing to take him and follow him there.  5) Social Issues - Appears that patient is currently homeless. He is living with his ex-wife right now, but not sure how long that will last. If he continues to follow up with our team will get CSW involved, however concerned that he will not continue to follow up. Providing taxi service for patient to get to appointments. 6) Atrial fibrillation - Chronic, EKG afib 80 bpm. Continue Xarelto 20 mg daily. No  bleeding issues.    Spent over 40 minutes with patient with 1/2 of the time spent on education and discussion of the above.  Ulla Potash B NP-C 12:45 PM

## 2014-06-07 NOTE — ED Provider Notes (Signed)
Pt seen and evaluated.  Discussed with Dr. Lillard Anes.  No ekg changes, and normal troponins.  No further recommendations per cardilology.  Agree with home, and follow up.  Pt seen and evaluated.  Reports improvement with pain meds.  No dyspnea.  Clear lungs, afebrile, sate 95%.  Rolland Porter, MD 06/07/14 [phone]

## 2014-06-08 ENCOUNTER — Ambulatory Visit (HOSPITAL_COMMUNITY)
Admit: 2014-06-08 | Discharge: 2014-06-08 | Disposition: A | Discharge status: Home / Self Care | Payer: Medicare Other | Source: Ambulatory Visit | Attending: Internal Medicine | Admitting: Internal Medicine

## 2014-06-08 ENCOUNTER — Encounter (HOSPITAL_COMMUNITY): Payer: Self-pay

## 2014-06-08 VITALS — BP 107/63 | HR 76 | Resp 18 | Wt 184.5 lb

## 2014-06-08 DIAGNOSIS — I4589 Other specified conduction disorders: Secondary | ICD-10-CM | POA: Diagnosis not present

## 2014-06-08 DIAGNOSIS — I2510 Atherosclerotic heart disease of native coronary artery without angina pectoris: Secondary | ICD-10-CM | POA: Diagnosis not present

## 2014-06-08 DIAGNOSIS — F17200 Nicotine dependence, unspecified, uncomplicated: Secondary | ICD-10-CM | POA: Insufficient documentation

## 2014-06-08 DIAGNOSIS — Z91199 Patient's noncompliance with other medical treatment and regimen due to unspecified reason: Secondary | ICD-10-CM | POA: Diagnosis not present

## 2014-06-08 DIAGNOSIS — Z9119 Patient's noncompliance with other medical treatment and regimen: Secondary | ICD-10-CM

## 2014-06-08 DIAGNOSIS — I428 Other cardiomyopathies: Secondary | ICD-10-CM | POA: Diagnosis not present

## 2014-06-08 DIAGNOSIS — I252 Old myocardial infarction: Secondary | ICD-10-CM | POA: Insufficient documentation

## 2014-06-08 DIAGNOSIS — Z5900 Homelessness unspecified: Secondary | ICD-10-CM | POA: Insufficient documentation

## 2014-06-08 DIAGNOSIS — Z95810 Presence of automatic (implantable) cardiac defibrillator: Secondary | ICD-10-CM | POA: Insufficient documentation

## 2014-06-08 DIAGNOSIS — I4891 Unspecified atrial fibrillation: Secondary | ICD-10-CM | POA: Diagnosis not present

## 2014-06-08 DIAGNOSIS — I5022 Chronic systolic (congestive) heart failure: Secondary | ICD-10-CM | POA: Insufficient documentation

## 2014-06-08 DIAGNOSIS — I059 Rheumatic mitral valve disease, unspecified: Secondary | ICD-10-CM | POA: Diagnosis not present

## 2014-06-08 DIAGNOSIS — I482 Chronic atrial fibrillation, unspecified: Secondary | ICD-10-CM

## 2014-06-08 DIAGNOSIS — Z720 Tobacco use: Secondary | ICD-10-CM

## 2014-06-08 NOTE — Patient Instructions (Signed)
Follow up in 3 weeks.  Bring all of your medications to your next appointment please.  Do the following things EVERYDAY: 1) Weigh yourself in the morning before breakfast. Write it down and keep it in a log. 2) Take your medicines as prescribed 3) Eat low salt foods-Limit salt (sodium) to 2000 mg per day.  4) Stay as active as you can everyday 5) Limit all fluids for the day to less than 2 liters

## 2014-06-09 NOTE — Addendum Note (Signed)
Encounter addended by: Merlene Pulling, CCT on: 06/09/2014  7:59 AM<BR>     Documentation filed: Charges VN

## 2014-06-09 NOTE — ED Provider Notes (Signed)
I saw and evaluated the patient, reviewed the resident's note and I agree with the findings and plan.   EKG Interpretation None        Rolland Porter, MD 06/09/14 [phone]

## 2014-06-09 NOTE — Addendum Note (Signed)
Encounter addended by: Rene Sizelove, CCT on: 06/09/2014  7:59 AM<BR>     Documentation filed: Charges VN

## 2014-06-11 ENCOUNTER — Encounter (HOSPITAL_COMMUNITY): Payer: Self-pay | Admitting: Emergency Medicine

## 2014-06-11 ENCOUNTER — Emergency Department (HOSPITAL_COMMUNITY)
Admission: EM | Admit: 2014-06-11 | Discharge: 2014-06-12 | Disposition: A | Discharge status: Home / Self Care | Payer: Medicare Other | Attending: Emergency Medicine | Admitting: Emergency Medicine

## 2014-06-11 ENCOUNTER — Emergency Department (HOSPITAL_COMMUNITY): Payer: Medicare Other

## 2014-06-11 DIAGNOSIS — T82198A Other mechanical complication of other cardiac electronic device, initial encounter: Secondary | ICD-10-CM | POA: Insufficient documentation

## 2014-06-11 DIAGNOSIS — Z8719 Personal history of other diseases of the digestive system: Secondary | ICD-10-CM | POA: Diagnosis not present

## 2014-06-11 DIAGNOSIS — Z4502 Encounter for adjustment and management of automatic implantable cardiac defibrillator: Secondary | ICD-10-CM

## 2014-06-11 DIAGNOSIS — Z862 Personal history of diseases of the blood and blood-forming organs and certain disorders involving the immune mechanism: Secondary | ICD-10-CM | POA: Diagnosis not present

## 2014-06-11 DIAGNOSIS — Y831 Surgical operation with implant of artificial internal device as the cause of abnormal reaction of the patient, or of later complication, without mention of misadventure at the time of the procedure: Secondary | ICD-10-CM | POA: Diagnosis not present

## 2014-06-11 DIAGNOSIS — I5022 Chronic systolic (congestive) heart failure: Secondary | ICD-10-CM | POA: Diagnosis not present

## 2014-06-11 DIAGNOSIS — F17200 Nicotine dependence, unspecified, uncomplicated: Secondary | ICD-10-CM | POA: Diagnosis not present

## 2014-06-11 DIAGNOSIS — I2510 Atherosclerotic heart disease of native coronary artery without angina pectoris: Secondary | ICD-10-CM | POA: Insufficient documentation

## 2014-06-11 DIAGNOSIS — I252 Old myocardial infarction: Secondary | ICD-10-CM | POA: Diagnosis not present

## 2014-06-11 DIAGNOSIS — Z79899 Other long term (current) drug therapy: Secondary | ICD-10-CM | POA: Diagnosis not present

## 2014-06-11 DIAGNOSIS — Z9889 Other specified postprocedural states: Secondary | ICD-10-CM | POA: Diagnosis not present

## 2014-06-11 DIAGNOSIS — I10 Essential (primary) hypertension: Secondary | ICD-10-CM | POA: Insufficient documentation

## 2014-06-11 DIAGNOSIS — Z7901 Long term (current) use of anticoagulants: Secondary | ICD-10-CM | POA: Insufficient documentation

## 2014-06-11 DIAGNOSIS — Z8673 Personal history of transient ischemic attack (TIA), and cerebral infarction without residual deficits: Secondary | ICD-10-CM | POA: Insufficient documentation

## 2014-06-11 LAB — CBC
HCT: 34.7 % — ABNORMAL LOW (ref 39.0–52.0)
Hemoglobin: 11 g/dL — ABNORMAL LOW (ref 13.0–17.0)
MCH: 24.1 pg — ABNORMAL LOW (ref 26.0–34.0)
MCHC: 31.7 g/dL (ref 30.0–36.0)
MCV: 76.1 fL — ABNORMAL LOW (ref 78.0–100.0)
Platelets: 195 10*3/uL (ref 150–400)
RBC: 4.56 MIL/uL (ref 4.22–5.81)
RDW: 25.3 % — ABNORMAL HIGH (ref 11.5–15.5)
WBC: 4.2 10*3/uL (ref 4.0–10.5)

## 2014-06-11 NOTE — ED Notes (Signed)
Pt. States about 2130 he felt a "weak shock" from his ICD and an "explosion in his brain." Called EMS because of SOB and Headache. Denies LOC. Has a burning pain across his chest.

## 2014-06-11 NOTE — ED Provider Notes (Signed)
CSN: [id number]     Arrival date & time 06/11/14  2305 History   First MD Initiated Contact with Patient 06/11/14 2306     Chief Complaint  Patient presents with  . AICD Problem   (Consider location/radiation/quality/duration/timing/severity/associated sxs/prior Treatment) HPI Tim Reyes is a 48 yo male presenting with report of feeling like his ICD fired tonight.  He states appr 1.5 hr ago, pt felt an explosion in his chest and body.  He currently states he has chest pain across his chest he describes as a burning pain and rates as 8/10.  He denies any dizziness, palpitations, shortness of breath or other symptoms just prior to the event.  He denies fever, chills, chest pain, shortness of breath, cough, nausea, vomiting or diarrhea recently.    Past Medical History  Diagnosis Date  . Essential hypertension, benign   . MI (myocardial infarction)   . ICD (implantable cardioverter-defibrillator) in place   . Chronic atrial fibrillation   . Cardiomyopathy     a) NICM/ICM  b) LHC (04/2014): 1. Mild to moderate non-obstructive CAD  . Sleep apnea   . Coronary atherosclerosis of native coronary artery     PCTA 2005 - single vessel, reportedly patent 2012  . Noncompliance   . Cirrhosis   . Chronic systolic heart failure     a) NICM/ICM b) RHC (04/2014): RA: 17, RV: 55/6/15, PA: 54/27 (38), PCWP: 20,  Fick CO/CI: 4.2 /2.0, PVR: 4.3 WU, PA sat: 46%, 46%  . Iron deficiency anemia    Past Surgical History  Procedure Laterality Date  . Tonsillectomy    . Ep implantable device     Family History  Problem Relation Age of Onset  . Heart disease Father   . Heart disease Sister   . Heart disease Mother    History  Substance Use Topics  . Smoking status: Current Every Day Smoker -- 0.50 packs/day for 15 years    Types: Cigarettes  . Smokeless tobacco: Not on file  . Alcohol Use: Yes     Comment: occasional    Review of Systems  Constitutional: Negative for fever and chills.  HENT:  Negative for sore throat.   Eyes: Negative for visual disturbance.  Respiratory: Negative for cough and shortness of breath.   Cardiovascular: Positive for chest pain. Negative for leg swelling.  Gastrointestinal: Negative for nausea, vomiting and diarrhea.  Genitourinary: Negative for dysuria.  Musculoskeletal: Negative for myalgias.  Skin: Negative for rash.  Neurological: Negative for weakness, numbness and headaches.    Allergies  Vicodin and Tramadol  Home Medications   Prior to Admission medications   Medication Sig Start Date End Date Taking? Authorizing Provider  albuterol (PROVENTIL) (2.5 MG/3ML) 0.083% nebulizer solution Take 3 mLs (2.5 mg total) by nebulization every 6 (six) hours as needed for wheezing or shortness of breath. 04/30/14   Osvaldo Shipper, MD  atorvastatin (LIPITOR) 40 MG tablet Take 1 tablet (40 mg total) by mouth daily. 06/01/14   Aundria Rud, NP  furosemide (LASIX) 40 MG tablet Take 1 tablet (40 mg total) by mouth 2 (two) times daily. 06/01/14   Aundria Rud, NP  isosorbide-hydrALAZINE (BIDIL) 20-37.5 MG per tablet Take 1 tablet by mouth 3 (three) times daily. 06/01/14   Aundria Rud, NP  lisinopril (PRINIVIL,ZESTRIL) 2.5 MG tablet Take 1 tablet (2.5 mg total) by mouth 2 (two) times daily. 06/01/14   Aundria Rud, NP  milrinone [facility]) 20 MG/100ML SOLN infusion Inject 32.475 mcg/min into  the vein continuous. 06/01/14   Aundria Rud, NP  omeprazole (PRILOSEC) 20 MG capsule Take 1 capsule (20 mg total) by mouth 2 (two) times daily before a meal. 04/30/14   Osvaldo Shipper, MD  potassium chloride (K-DUR) 10 MEQ tablet Take 2 tablets (20 mEq total) by mouth 2 (two) times daily. 06/01/14   Aundria Rud, NP  rivaroxaban (XARELTO) 20 MG TABS tablet Take 1 tablet (20 mg total) by mouth daily with supper. 06/01/14   Aundria Rud, NP  sertraline (ZOLOFT) 50 MG tablet Take 1 tablet (50 mg total) by mouth daily. 04/30/14   Osvaldo Shipper, MD  spironolactone  (ALDACTONE) 25 MG tablet Take 1 tablet (25 mg total) by mouth daily. 06/01/14   Aundria Rud, NP   BP 127/100  Pulse 91  Temp(Src) 98.2 F (36.8 C) (Oral)  Resp 19  SpO2 100% Physical Exam  Nursing note and vitals reviewed. Constitutional: He is oriented to person, place, and time. He appears well-developed and well-nourished. No distress.  HENT:  Head: Normocephalic and atraumatic.  Mouth/Throat: Oropharynx is clear and moist. No oropharyngeal exudate.  Eyes: Conjunctivae are normal. Pupils are equal, round, and reactive to light. Scleral icterus is present.  Neck: Neck supple. No thyromegaly present.  Cardiovascular: Normal rate, S1 normal, S2 normal and intact distal pulses.  An irregular rhythm present.  Extrasystoles are present. Exam reveals gallop and S3. Exam reveals no friction rub.   No murmur heard. Pulses:      Radial pulses are 2+ on the right side, and 2+ on the left side.       Dorsalis pedis pulses are 2+ on the right side, and 2+ on the left side.  Pulmonary/Chest: Effort normal and breath sounds normal. No respiratory distress. He has no decreased breath sounds. He has no wheezes. He has no rhonchi. He has no rales. He exhibits no tenderness.  Abdominal: Soft. There is no tenderness.  Musculoskeletal: He exhibits no tenderness.  Lymphadenopathy:    He has no cervical adenopathy.  Neurological: He is alert and oriented to person, place, and time. No cranial nerve deficit.  Skin: Skin is warm and dry. No rash noted. He is not diaphoretic.  Psychiatric: He has a normal mood and affect.    ED Course  Procedures (including critical care time) Labs Review Labs Reviewed  CBC - Abnormal; Notable for the following:    Hemoglobin 11.0 (*)    HCT 34.7 (*)    MCV 76.1 (*)    MCH 24.1 (*)    RDW 25.3 (*)    All other components within normal limits  COMPREHENSIVE METABOLIC PANEL - Abnormal; Notable for the following:    Total Bilirubin 2.0 (*)    All other components  within normal limits  PRO B NATRIURETIC PEPTIDE - Abnormal; Notable for the following:    Pro B Natriuretic peptide (BNP) 2939.0 (*)    All other components within normal limits  TROPONIN I    Imaging Review Dg Chest 2 View  06/12/2014   CLINICAL DATA:  Chest pain today.  Defibrillator fired.  EXAM: CHEST  2 VIEW  COMPARISON:  06/06/2014  FINDINGS: Cardiac pacemaker and right PICC catheter are unchanged in position. Cardiac enlargement without vascular congestion. Lungs are clear. No blunting of costophrenic angles. No pneumothorax. No change since prior study.  IMPRESSION: Cardiac enlargement.  No evidence of active pulmonary disease.   Electronically Signed   By: Burman Nieves M.D.   On:  06/12/2014 00:36    EKG Interpretation:    Date/Time:  Saturday June 11 2014 23:14 Ventricular Rate: 96   PR Interval:  * QRS Duration: 119 QT Interval:  392 QTC Calculation: 495 P-R-T Axes: -1,  124, -58 Atrial Fibrillation Nonspecific intraventricular conduction delay Low voltage, extremity leads Nonspecific T abnormalities, lateral leads.   MDM   Final diagnoses:  Encounter for assessment of automatic implantable cardioverter-defibrillator (AICD)   48 yo male, report of ICD firing x 1, no acute distress.  CBC, CMP, Troponin, without significant abnormality.  BNP: 2939, consistent with pt's heart failure and improved from last level. CXR: cardiac enlargement but no acute illness.  EKG:  Without changes from last tracing.  [employer] rep coming to interrogate ICD.  Tylenol for headache.  Pt continues to rest without distress.  Case discussed with and hand-off given to Dr. Elesa Massed.  Awaiting [employer] rep.  Plan includes discharge after ICD interrogation, barring any status changes.   Filed Vitals:   06/11/14 2317 06/11/14 2345 06/12/14 0101 06/12/14 0145  BP: 127/100 121/101 121/93 123/94  Pulse: 91 73 90 103  Temp: 98.2 F (36.8 C)     TempSrc: Oral     Resp: 19 16 19 20   SpO2:  100% 100% 91% 97%    Meds given in ED:  Medications  acetaminophen (TYLENOL) tablet 1,000 mg (1,000 mg Oral Given 06/12/14 0106)  heparin lock flush 100 unit/mL (500 Units Intracatheter Given 06/12/14 0202)    Discharge Medication List as of 06/12/2014  1:51 AM        Harle Battiest, NP 06/13/14 [phone]

## 2014-06-12 DIAGNOSIS — T82198A Other mechanical complication of other cardiac electronic device, initial encounter: Secondary | ICD-10-CM | POA: Diagnosis not present

## 2014-06-12 LAB — COMPREHENSIVE METABOLIC PANEL
ALT: 11 U/L (ref 0–53)
AST: 23 U/L (ref 0–37)
Albumin: 3.6 g/dL (ref 3.5–5.2)
Alkaline Phosphatase: 69 U/L (ref 39–117)
Anion gap: 15 (ref 5–15)
BILIRUBIN TOTAL: 2 mg/dL — AB (ref 0.3–1.2)
BUN: 19 mg/dL (ref 6–23)
CHLORIDE: 103 meq/L (ref 96–112)
CO2: 23 meq/L (ref 19–32)
Calcium: 9.2 mg/dL (ref 8.4–10.5)
Creatinine, Ser: 0.94 mg/dL (ref 0.50–1.35)
Glucose, Bld: 87 mg/dL (ref 70–99)
Potassium: 3.8 mEq/L (ref 3.7–5.3)
SODIUM: 141 meq/L (ref 137–147)
Total Protein: 7.1 g/dL (ref 6.0–8.3)

## 2014-06-12 LAB — TROPONIN I: Troponin I: <0.3 ng/mL (ref <0.30)

## 2014-06-12 LAB — PRO B NATRIURETIC PEPTIDE: Pro B Natriuretic peptide (BNP): 2939 pg/mL — ABNORMAL HIGH (ref 0–125)

## 2014-06-12 MED ORDER — ACETAMINOPHEN 500 MG PO TABS
1000.0000 mg | ORAL_TABLET | Freq: Once | ORAL | Status: AC
  Administered 2014-06-12: 1000 mg via ORAL
  Filled 2014-06-12: qty 2

## 2014-06-12 MED ORDER — HEPARIN SOD (PORK) LOCK FLUSH 100 UNIT/ML IV SOLN
500.0000 [IU] | Freq: Once | INTRAVENOUS | Status: AC
  Administered 2014-06-12: 500 [IU]
  Filled 2014-06-12: qty 5

## 2014-06-12 NOTE — Discharge Instructions (Signed)

## 2014-06-12 NOTE — ED Notes (Signed)
Representative from [employer] here for interrogation

## 2014-06-12 NOTE — ED Provider Notes (Addendum)
Medical screening examination/treatment/procedure(s) were conducted as a shared visit with non-physician practitioner(s) and myself.  I personally evaluated the patient during the encounter.   Date: 06/12/2014 23:14  Rate: 96  Rhythm: Atrial fibrillation  QRS Axis: normal  Intervals: Nonspecific intraventricular conduction delay  ST/T Wave abnormalities: T wave inversions in lateral leads  Conduction Disutrbances: none  Narrative Interpretation: Atrial fibrillation, nonspecific intraventricular conduction delay, T wave inversions in lateral leads, no significant change compared to prior EKG    Pt is a 48 y.o. M with history of hypertension, CAD, nonischemic cardiomyopathy status post AICD placement currently on milrinone drip who presents emergency department after he felt his AICD fire tonight around 7 PM while at rest. He has had some burning chest pain and headache afterwards. States his chest pain is completely gone is complaining of mild headache. Denies shortness of breath currently. No lower extremity swelling. States that he feels that his CHF is doing well. No fevers or cough. On exam, patient is hemodynamically stable, lungs are clear to auscultation, no peripheral edema or JVD, no calf tenderness. Patient's troponin is negative. Chest x-ray shows no edema or infiltrate. His device has been interrogated. He did have atrial fibrillation which he has a known history of is on chronic Xarelto.  No ventricular fibrillation, ventricular tachycardia. There was no episode of firing of the AICD. I feel he is safe to be discharged home. Discussed return precautions. We'll have her followup with his cardiologist as needed. For from a cardiac standpoint, patient is stable. I am not concerned about his ring chest pain as it is very atypical, lasted several seconds and is currently gone.  Layla Maw Dearies Meikle, DO 06/12/14 0150  Layla Maw Gracin Mcpartland, DO 06/12/14 0202

## 2014-06-12 NOTE — ED Notes (Signed)
Pt. Refused wheelchair 

## 2014-06-20 ENCOUNTER — Telehealth (HOSPITAL_COMMUNITY): Payer: Self-pay | Admitting: Vascular Surgery

## 2014-06-20 NOTE — Telephone Encounter (Signed)
Pt called he moved to [place] this past weekend.. He does not know if he can make it to his appt on OCT 14 without help to get on the train or bus. Please advise

## 2014-06-21 ENCOUNTER — Other Ambulatory Visit (HOSPITAL_COMMUNITY): Payer: Self-pay | Admitting: *Deleted

## 2014-06-21 MED ORDER — LISINOPRIL 2.5 MG PO TABS: 2.5000 mg | ORAL_TABLET | Freq: Two times a day (BID) | ORAL | Status: DC

## 2014-06-21 MED ORDER — FUROSEMIDE 40 MG PO TABS: 40.0000 mg | ORAL_TABLET | Freq: Two times a day (BID) | ORAL | Status: DC

## 2014-06-21 MED ORDER — ISOSORB DINITRATE-HYDRALAZINE 20-37.5 MG PO TABS: 1.0000 | ORAL_TABLET | Freq: Three times a day (TID) | ORAL | Status: DC

## 2014-06-21 MED ORDER — SPIRONOLACTONE 25 MG PO TABS: 25.0000 mg | ORAL_TABLET | Freq: Every day | ORAL | Status: DC

## 2014-06-21 MED ORDER — RIVAROXABAN 20 MG PO TABS: 20.0000 mg | ORAL_TABLET | Freq: Every day | ORAL | Status: DC

## 2014-06-21 MED ORDER — POTASSIUM CHLORIDE ER 10 MEQ PO TBCR
20.0000 meq | EXTENDED_RELEASE_TABLET | Freq: Two times a day (BID) | ORAL | Status: DC
Start: 2014-06-21 — End: 2014-07-18

## 2014-06-21 MED ORDER — ATORVASTATIN CALCIUM 40 MG PO TABS
40.0000 mg | ORAL_TABLET | Freq: Every day | ORAL | Status: DC
Start: 2014-06-21 — End: 2014-07-18

## 2014-06-22 ENCOUNTER — Telehealth (HOSPITAL_COMMUNITY): Payer: Self-pay | Admitting: Vascular Surgery

## 2014-06-22 ENCOUNTER — Encounter: Payer: Self-pay | Admitting: Internal Medicine

## 2014-06-22 NOTE — Telephone Encounter (Signed)
Pt gained 12.6 lbs since last wed.. Please advise

## 2014-06-22 NOTE — Telephone Encounter (Signed)
Have been unable to reach pt on either of his numbers listed, unable to reach [place] at [facility]

## 2014-06-29 ENCOUNTER — Encounter (HOSPITAL_COMMUNITY): Payer: Medicare Other

## 2014-06-30 ENCOUNTER — Telehealth (HOSPITAL_COMMUNITY): Payer: Self-pay | Admitting: *Deleted

## 2014-06-30 ENCOUNTER — Encounter (HOSPITAL_COMMUNITY): Payer: Self-pay | Admitting: Cardiology

## 2014-06-30 MED ORDER — METOLAZONE 5 MG PO TABS: 5.0000 mg | ORAL_TABLET | ORAL | Status: DC | PRN

## 2014-06-30 NOTE — Telephone Encounter (Signed)
Received call from [facility] RN she states pt's wt is up several lbs and his abd and legs are swollen he has increased Furosemide to 80 mg Twice daily for 3 days but wt continues to increase, pt no showed for appt yesterday and states he did not have transportation however when nurse went to his home he was not there.  Discussed w/Dr Bensimhon he recommends metolazone 5 mg for 2 days and pt needs to find local cardiologist we can no longer provide his care, discharge letter will be mailed.

## 2014-07-08 ENCOUNTER — Encounter: Payer: Self-pay | Admitting: Internal Medicine

## 2014-07-08 ENCOUNTER — Telehealth (HOSPITAL_COMMUNITY): Payer: Self-pay | Admitting: Vascular Surgery

## 2014-07-08 NOTE — Telephone Encounter (Signed)
[facility] called she would like a order for Medical social work.. Please advise

## 2014-07-13 ENCOUNTER — Encounter: Payer: Self-pay | Admitting: Internal Medicine

## 2014-07-14 ENCOUNTER — Encounter (HOSPITAL_COMMUNITY): Payer: Medicare Other

## 2014-07-18 ENCOUNTER — Encounter (HOSPITAL_COMMUNITY): Payer: Medicare Other

## 2014-07-18 ENCOUNTER — Other Ambulatory Visit (HOSPITAL_COMMUNITY): Payer: Self-pay

## 2014-07-18 ENCOUNTER — Ambulatory Visit (HOSPITAL_COMMUNITY)
Admission: RE | Admit: 2014-07-18 | Discharge: 2014-07-18 | Disposition: A | Discharge status: Home / Self Care | Payer: Medicare Other | Source: Ambulatory Visit | Attending: Internal Medicine | Admitting: Internal Medicine

## 2014-07-18 ENCOUNTER — Encounter (HOSPITAL_COMMUNITY): Payer: Self-pay

## 2014-07-18 ENCOUNTER — Telehealth (HOSPITAL_COMMUNITY): Payer: Self-pay

## 2014-07-18 VITALS — BP 91/62 | HR 70 | Resp 18 | Wt 195.5 lb

## 2014-07-18 DIAGNOSIS — I482 Chronic atrial fibrillation: Secondary | ICD-10-CM | POA: Diagnosis not present

## 2014-07-18 DIAGNOSIS — Z9119 Patient's noncompliance with other medical treatment and regimen: Secondary | ICD-10-CM | POA: Insufficient documentation

## 2014-07-18 DIAGNOSIS — I2510 Atherosclerotic heart disease of native coronary artery without angina pectoris: Secondary | ICD-10-CM | POA: Insufficient documentation

## 2014-07-18 DIAGNOSIS — F71 Moderate intellectual disabilities: Secondary | ICD-10-CM | POA: Diagnosis not present

## 2014-07-18 DIAGNOSIS — I5022 Chronic systolic (congestive) heart failure: Secondary | ICD-10-CM | POA: Diagnosis not present

## 2014-07-18 LAB — BASIC METABOLIC PANEL
Anion gap: 13 (ref 5–15)
BUN: 13 mg/dL (ref 6–23)
CHLORIDE: 102 meq/L (ref 96–112)
CO2: 27 mEq/L (ref 19–32)
Calcium: 9.3 mg/dL (ref 8.4–10.5)
Creatinine, Ser: 1.06 mg/dL (ref 0.50–1.35)
GFR calc Af Amer: >90 mL/min (ref >90)
GFR calc non Af Amer: 81 mL/min — ABNORMAL LOW (ref >90)
Glucose, Bld: 112 mg/dL — ABNORMAL HIGH (ref 70–99)
Potassium: 3.3 mEq/L — ABNORMAL LOW (ref 3.7–5.3)
Sodium: 142 mEq/L (ref 137–147)

## 2014-07-18 LAB — PRO B NATRIURETIC PEPTIDE: Pro B Natriuretic peptide (BNP): 1698 pg/mL — ABNORMAL HIGH (ref 0–125)

## 2014-07-18 MED ORDER — METOLAZONE 5 MG PO TABS: 5.0000 mg | ORAL_TABLET | ORAL | Status: AC | PRN

## 2014-07-18 MED ORDER — RIVAROXABAN 20 MG PO TABS: 20.0000 mg | ORAL_TABLET | Freq: Every day | ORAL | Status: AC

## 2014-07-18 MED ORDER — POTASSIUM CHLORIDE ER 10 MEQ PO TBCR: 20.0000 meq | EXTENDED_RELEASE_TABLET | Freq: Two times a day (BID) | ORAL | Status: DC

## 2014-07-18 MED ORDER — ISOSORB DINITRATE-HYDRALAZINE 20-37.5 MG PO TABS: 1.0000 | ORAL_TABLET | Freq: Three times a day (TID) | ORAL | Status: AC

## 2014-07-18 MED ORDER — SPIRONOLACTONE 25 MG PO TABS: 25.0000 mg | ORAL_TABLET | Freq: Every day | ORAL | Status: AC

## 2014-07-18 MED ORDER — LISINOPRIL 2.5 MG PO TABS: 2.5000 mg | ORAL_TABLET | Freq: Two times a day (BID) | ORAL | Status: AC

## 2014-07-18 MED ORDER — FUROSEMIDE 40 MG PO TABS: 40.0000 mg | ORAL_TABLET | Freq: Two times a day (BID) | ORAL | Status: AC

## 2014-07-18 MED ORDER — ATORVASTATIN CALCIUM 40 MG PO TABS: 40.0000 mg | ORAL_TABLET | Freq: Every day | ORAL | Status: AC

## 2014-07-18 NOTE — Telephone Encounter (Signed)
Lab results reviewed with patient.  Instructed tot ake extra 40 meq potassium today only on top of regular daily dose.  Aware and agreeable. Ave Filter

## 2014-07-18 NOTE — Patient Instructions (Signed)
Take 2.5 mg metolazone for 2 days.  Bring all your medications to all your appointments.   Need to establish care with another cardiologist.  Sent all medications to your pharmacy.  Do the following things EVERYDAY: 1) Weigh yourself in the morning before breakfast. Write it down and keep it in a log. 2) Take your medicines as prescribed 3) Eat low salt foods-Limit salt (sodium) to 2000 mg per day.  4) Stay as active as you can everyday 5) Limit all fluids for the day to less than 2 liters 6)

## 2014-07-18 NOTE — Progress Notes (Signed)
Patient ID: Tim RamsayDavid Reyes, male   DOB: 04/18/1966, 48 y.o.   MRN: [id number] PCP: N/A Primary Cardiologist: Previously Dr Tim RockerLindsey Reyes [facility])   HPI: Mr. Tim Reyes is a 48 yo male from [place] with a history of NICM/ICM s/p ICD ([employer]), chronic systolic HF, CAD s/p angioplasty x 1 vessel (2005), chronic atrial fibrillation, HTN, OSA, mod/sev MR, severe TR and non-compliance.  He has been admitted 4 times in the past 6 months for A/C HF. Patient had RHC showing low output and was started on milrinone. Medications titrated and diuresed and discharge weight was 181 lbs (06/01/14)  Follow up for Heart Failure: Since last visit patient has missed multiple appointments and has moved to [place]. He reports he is working on getting Medicaid card changed and then will find cardiologist down there. Seen in the ED down there 2 weeks a ago. Did not bring his medications with him and does not know the names of them. Reports breathing "so so." Gets SOB sometimes even with sitting. Denies CP, PND or orthopnea. Weight at home 185 lbs. Reports taking medications as prescribed and following a low salt diet.  ROS: All systems negative except as listed in HPI, PMH and Problem List.  SH:  History   Social History  . Marital Status: Single    Spouse Name: N/A    Number of Children: N/A  . Years of Education: N/A   Occupational History  . Not on file.   Social History Main Topics  . Smoking status: Current Every Day Smoker -- 0.50 packs/day for 15 years    Types: Cigarettes  . Smokeless tobacco: Never Used  . Alcohol Use: No     Comment: occasional  . Drug Use: 2.00 per week    Special: Marijuana  . Sexual Activity: Yes    Birth Control/ Protection: Condom   Other Topics Concern  . Not on file   Social History Narrative    FH:  Family History  Problem Relation Age of Onset  . Heart disease Father   . Heart disease Sister   . Heart disease Mother     Past Medical  History  Diagnosis Date  . Essential hypertension, benign   . MI (myocardial infarction)   . ICD (implantable cardioverter-defibrillator) in place   . Chronic atrial fibrillation   . Cardiomyopathy     a) NICM/ICM  b) LHC (04/2014): 1. Mild to moderate non-obstructive CAD  . Sleep apnea   . Coronary atherosclerosis of native coronary artery     PCTA 2005 - single vessel, reportedly patent 2012  . Noncompliance   . Cirrhosis   . Chronic systolic heart failure     a) NICM/ICM b) RHC (04/2014): RA: 17, RV: 55/6/15, PA: 54/27 (38), PCWP: 20,  Fick CO/CI: 4.2 /2.0, PVR: 4.3 WU, PA sat: 46%, 46%  . Iron deficiency anemia     Current Outpatient Prescriptions  Medication Sig Dispense Refill  . albuterol (PROVENTIL HFA;VENTOLIN HFA) 108 (90 BASE) MCG/ACT inhaler Inhale 1 puff into the lungs every 6 (six) hours as needed for wheezing or shortness of breath.    Marland Kitchen. albuterol (PROVENTIL) (2.5 MG/3ML) 0.083% nebulizer solution Take 3 mLs (2.5 mg total) by nebulization every 6 (six) hours as needed for wheezing or shortness of breath. 75 mL 2  . atorvastatin (LIPITOR) 40 MG tablet Take 1 tablet (40 mg total) by mouth daily. 30 tablet 0  . furosemide (LASIX) 40 MG tablet Take 1 tablet (40 mg total)  by mouth 2 (two) times daily. 60 tablet 0  . isosorbide-hydrALAZINE (BIDIL) 20-37.5 MG per tablet Take 1 tablet by mouth 3 (three) times daily. 90 tablet 0  . lisinopril (PRINIVIL,ZESTRIL) 2.5 MG tablet Take 1 tablet (2.5 mg total) by mouth 2 (two) times daily. 60 tablet 0  . metolazone (ZAROXOLYN) 5 MG tablet Take 1 tablet (5 mg total) by mouth as needed. 5 tablet 0  . milrinone (PRIMACOR) 20 MG/100ML SOLN infusion Inject 32.475 mcg/min into the vein continuous. 100 mL 2  . potassium chloride (K-DUR) 10 MEQ tablet Take 2 tablets (20 mEq total) by mouth 2 (two) times daily. 120 tablet 3  . rivaroxaban (XARELTO) 20 MG TABS tablet Take 1 tablet (20 mg total) by mouth daily with supper. 30 tablet 0  .  spironolactone (ALDACTONE) 25 MG tablet Take 1 tablet (25 mg total) by mouth daily. 30 tablet 0   No current facility-administered medications for this encounter.    Filed Vitals:   07/18/14 1100  BP: 91/62  Pulse: 70  Resp: 18  Weight: 195 lb 8 oz (88.678 kg)  SpO2: 96%    PHYSICAL EXAM: General:  Well appearing. No resp difficulty HEENT: normal Neck: supple. JVP 8-9 Carotids 2+ bilaterally; no bruits. No lymphadenopathy or thryomegaly appreciated. Cor: PMI normal. Regular rate & irregular rhythm. No rubs, gallops or murmurs. Lungs: clear Abdomen: soft, nontender, nondistended. No hepatosplenomegaly. No bruits or masses. Good bowel sounds. Extremities: no cyanosis, clubbing, rash, edema, R arm 2L PICC Neuro: alert & orientedx3, cranial nerves grossly intact. Moves all 4 extremities w/o difficulty. Affect pleasant.   ASSESSMENT & PLAN:  1) Chronic systolic heart failure: Mixed NICM/ICM s/p ICD [employer]; EF 10-15% (04/2014) - Very difficult situation patient was seen here in August after moving [place]. He remains on milrinone 0.375 mcg through a PICC. He has missed multiple appointments and has difficulty traveling to his appointments. He does not know what medications he is taking and did not bring any of them with him. We sent a discharge letter out in the past few weeks to find a cardiologist in [place]. Will give him a little more time to find cardiologist but if the next month he does not find one will have to stop milrinone.  - NYHA III symptoms on milrinone and volume status elevated. Will have him take metolazone 2.5 mg x 2 days. Will continue lasix 40 mg daily if that is what he is taking.  - Check BMET and pro-BNP today.  - Getting weekly labs with [facility], however they have not been getting potassium or creatinine.   - Reinforced the need and importance of daily weights, a low sodium diet, and fluid restriction (less than 2 L a day). Instructed to call the HF clinic if  weight increases more than 3 lbs overnight or 5 lbs in a week.  2) CAD: - LHC (04/2014) mild to moderate non-obstructive CAD - no s/s of ischemia. Continue statin and ACE-I. 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   5) Atrial fibrillation - Chronic, rate controlled. Continue Xarelto 20 mg daily. No bleeding issues.     Ulla Potash B NP-C 11:09 AM

## 2014-08-02 ENCOUNTER — Encounter: Payer: Self-pay | Admitting: Internal Medicine

## 2014-08-02 DIAGNOSIS — I482 Chronic atrial fibrillation: Secondary | ICD-10-CM

## 2014-08-02 DIAGNOSIS — I5023 Acute on chronic systolic (congestive) heart failure: Secondary | ICD-10-CM

## 2014-08-02 DIAGNOSIS — I255 Ischemic cardiomyopathy: Secondary | ICD-10-CM

## 2014-08-02 DIAGNOSIS — Z452 Encounter for adjustment and management of vascular access device: Secondary | ICD-10-CM

## 2014-08-09 ENCOUNTER — Encounter: Payer: Self-pay | Admitting: Internal Medicine

## 2014-08-10 ENCOUNTER — Other Ambulatory Visit (HOSPITAL_COMMUNITY): Payer: Self-pay

## 2014-08-10 ENCOUNTER — Telehealth (HOSPITAL_COMMUNITY): Payer: Self-pay

## 2014-08-10 MED ORDER — POTASSIUM CHLORIDE ER 10 MEQ PO TBCR: 30.0000 meq | EXTENDED_RELEASE_TABLET | Freq: Two times a day (BID) | ORAL | Status: AC

## 2014-08-10 NOTE — Telephone Encounter (Signed)
Per Graciella Freer, increase potassium to 30 meq twice daily for K level of 3.5.  HH RN Joni Reining called back (# [phone]) to report patient does not feel well, SOB, HR irregular, weight up 5 lbs in 1 week.  Instructed to have him take PRN 5mg  tablet of metolzaone, then PM dose of 40mg  lasix 30 minutes after.  If he does nto feel better after or does not put out fluid, advised to seek medical assistance at nearest ED (lives in [place]).  Ave Filter

## 2014-08-25 ENCOUNTER — Encounter (HOSPITAL_COMMUNITY): Payer: Self-pay | Admitting: Internal Medicine

## 2014-08-31 ENCOUNTER — Encounter: Payer: Self-pay | Admitting: Internal Medicine

## 2014-09-12 ENCOUNTER — Encounter: Payer: Self-pay | Admitting: Internal Medicine

## 2014-09-27 ENCOUNTER — Encounter: Payer: Self-pay | Admitting: Internal Medicine

## 2015-08-22 IMAGING — CR DG CHEST 1V PORT
1 series · 1 of 1 positions shown · non-contrast
Comparison: None.

CLINICAL DATA: CHEST PAIN picc line placement

EXAM:
PORTABLE CHEST - 1 VIEW

[AP]
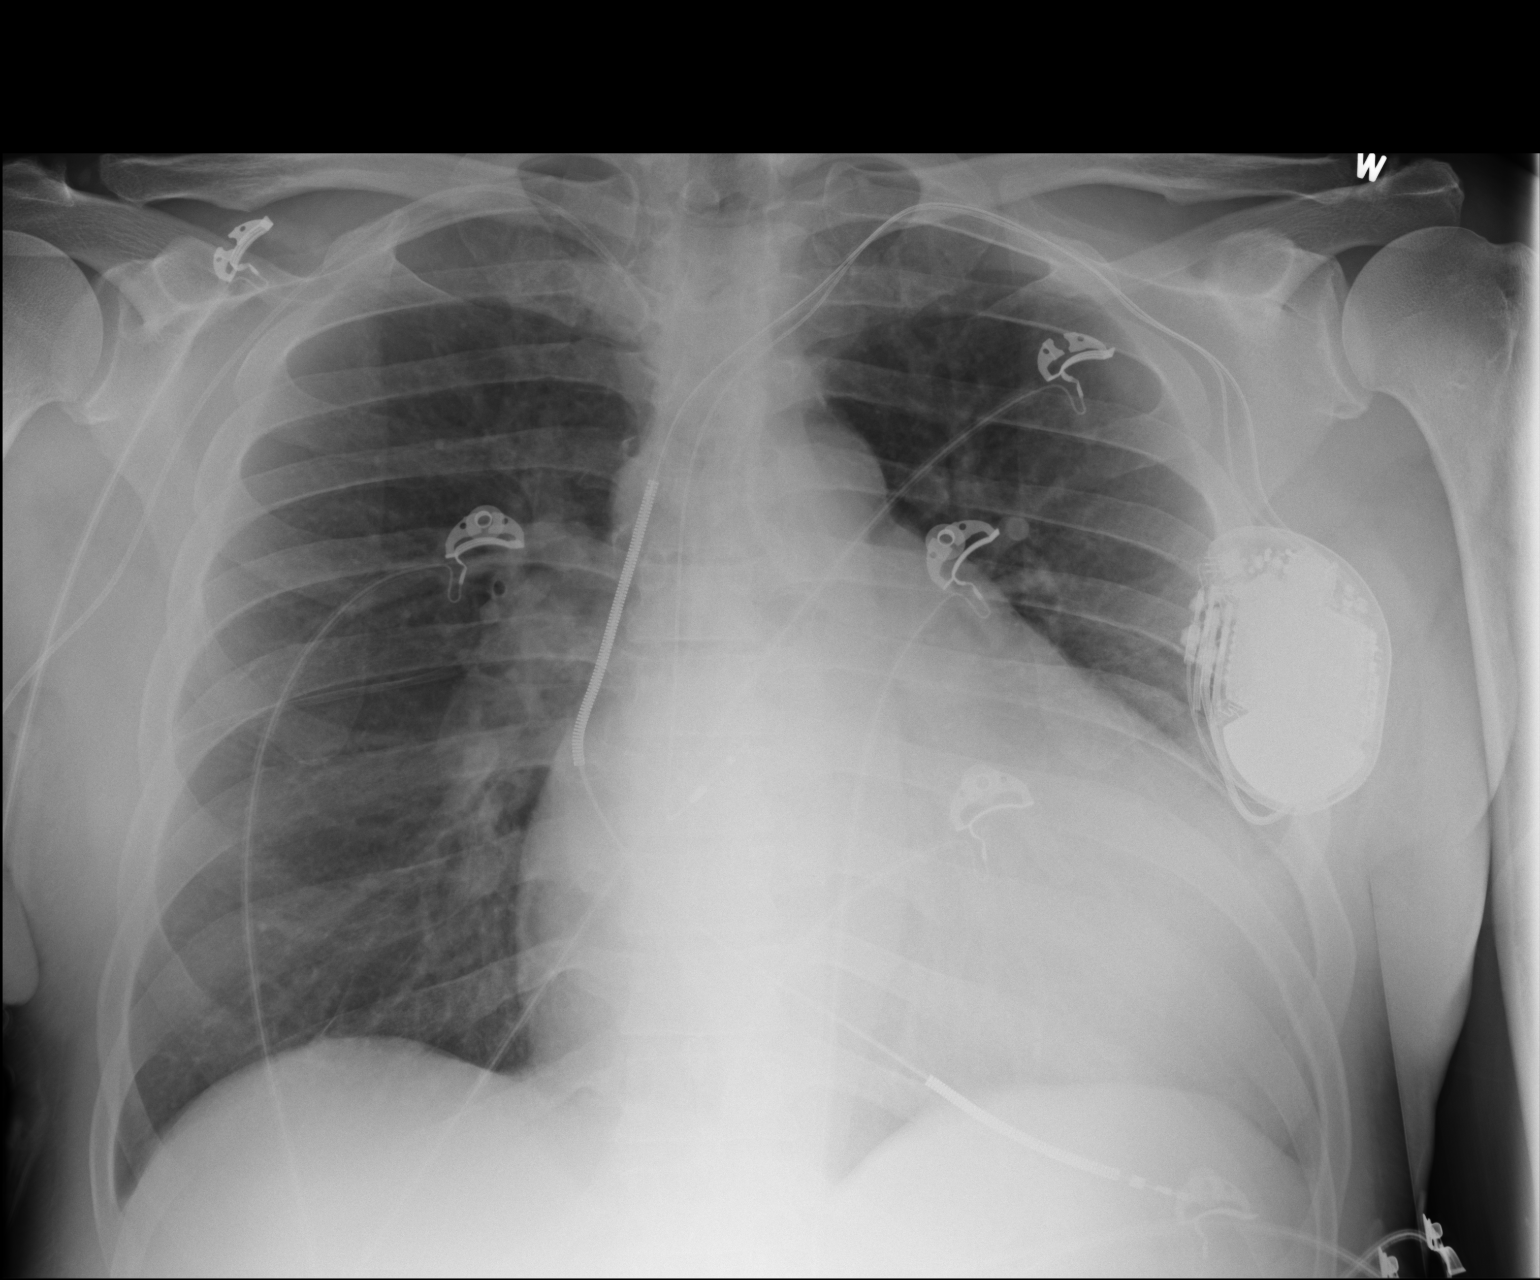

[1 of 1 positions shown; findings below may reference images not displayed]

FINDINGS: Left subclavian AICD. Moderate cardiomegaly. Right arm PICC extends
to the level of the right brachiocephalic vein. Lungs clear. No
effusion. Regional bones unremarkable.
IMPRESSION: 1. Cardiomegaly.
2. Right arm PICC to right brachiocephalic vein.

## 2015-08-23 IMAGING — CR DG CHEST 1V PORT
1 series · 1 of 1 positions shown · non-contrast
Comparison: 04/20/2014.

CLINICAL DATA: PICC line insertion.

EXAM:
PORTABLE CHEST - 1 VIEW

[AP]
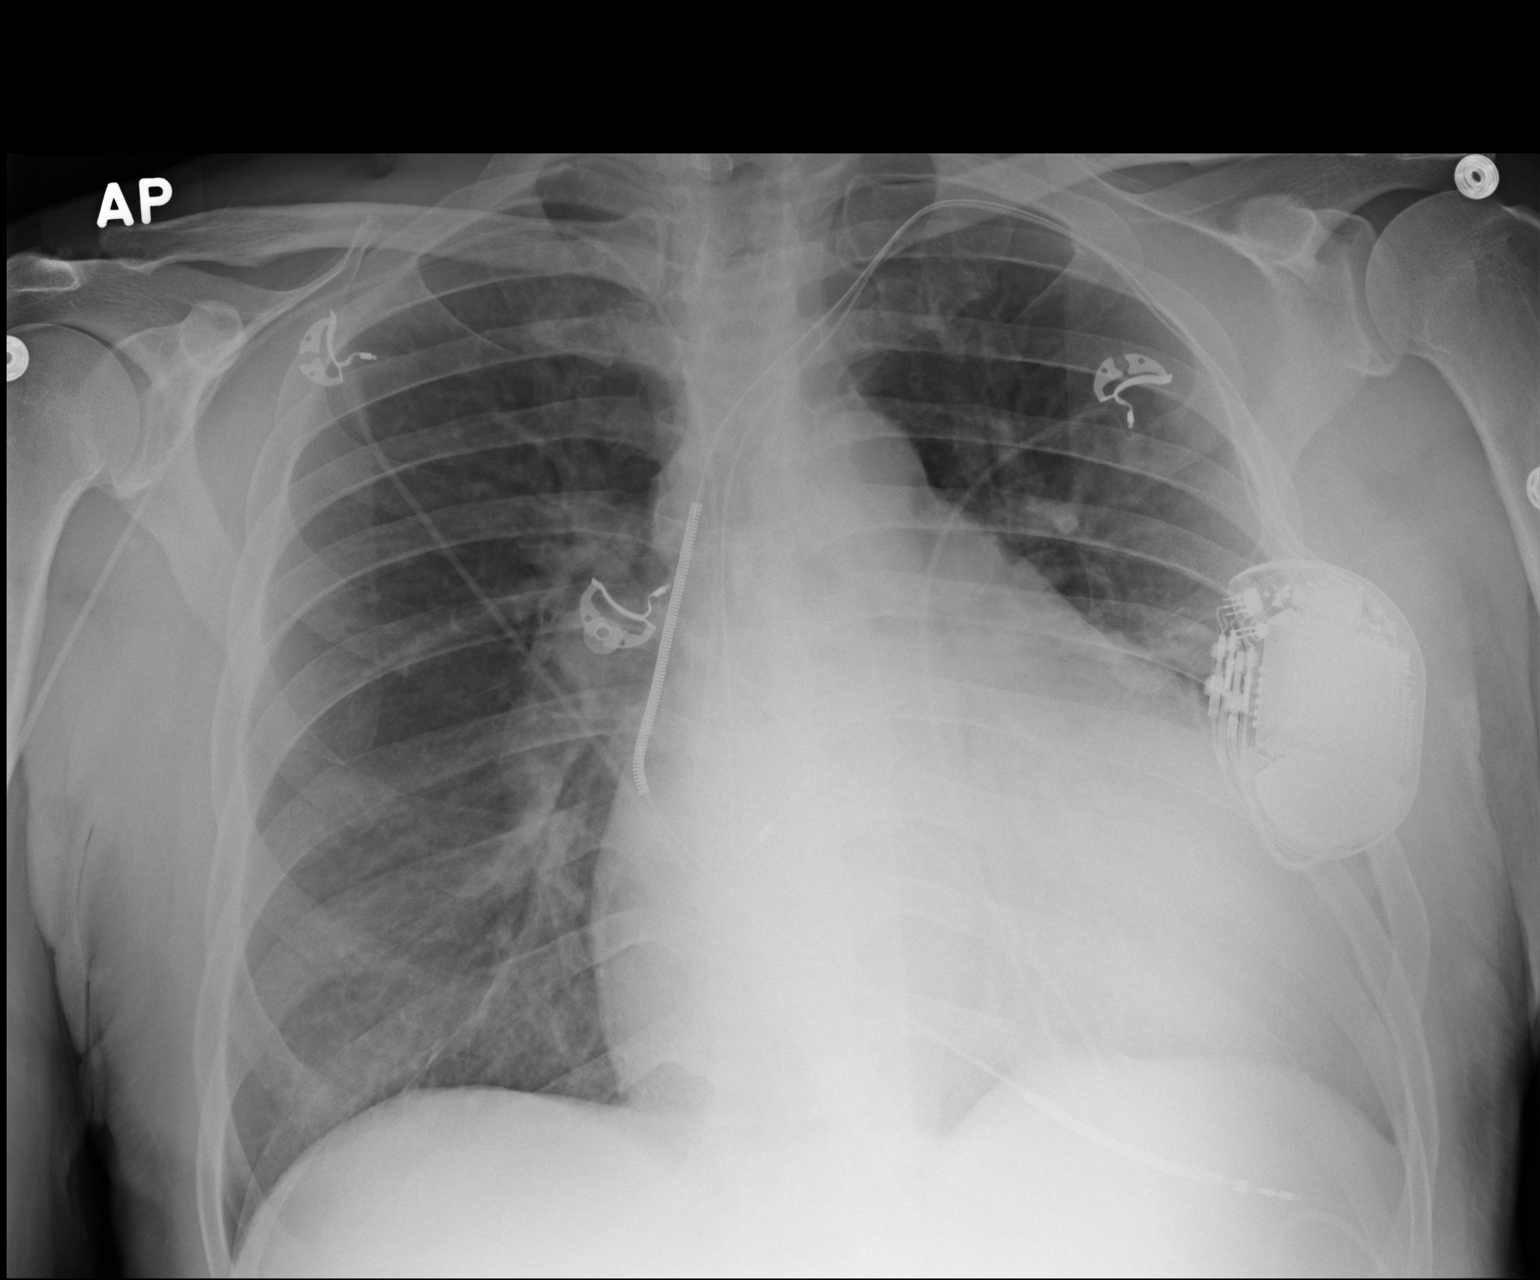

[1 of 1 positions shown; findings below may reference images not displayed]

FINDINGS: Cardiomegaly. Cardiac pacer noted with lead tips in the right atrium
and right ventricle. Pulmonary vascularity normal. PICC line has
been advanced. Its tip is at the cavoatrial junction. Lungs are
clear. No pleural effusion or pneumothorax.
IMPRESSION: 1. Interim advancement of PICC line. PICC line tip is at the
cavoatrial junction.
2. Severe cardiomegaly.  No CHF.  Cardiac pacer in stable position.

## 2015-08-30 IMAGING — CR DG CHEST 2V
2 series · 2 of 2 positions shown · non-contrast
Comparison: Portable chest x-ray April 21, 2014

CLINICAL DATA: Shortness of breath and chest pain with history of
defibrillator placement and tobacco use

EXAM:
CHEST  2 VIEW

[w chest pa]
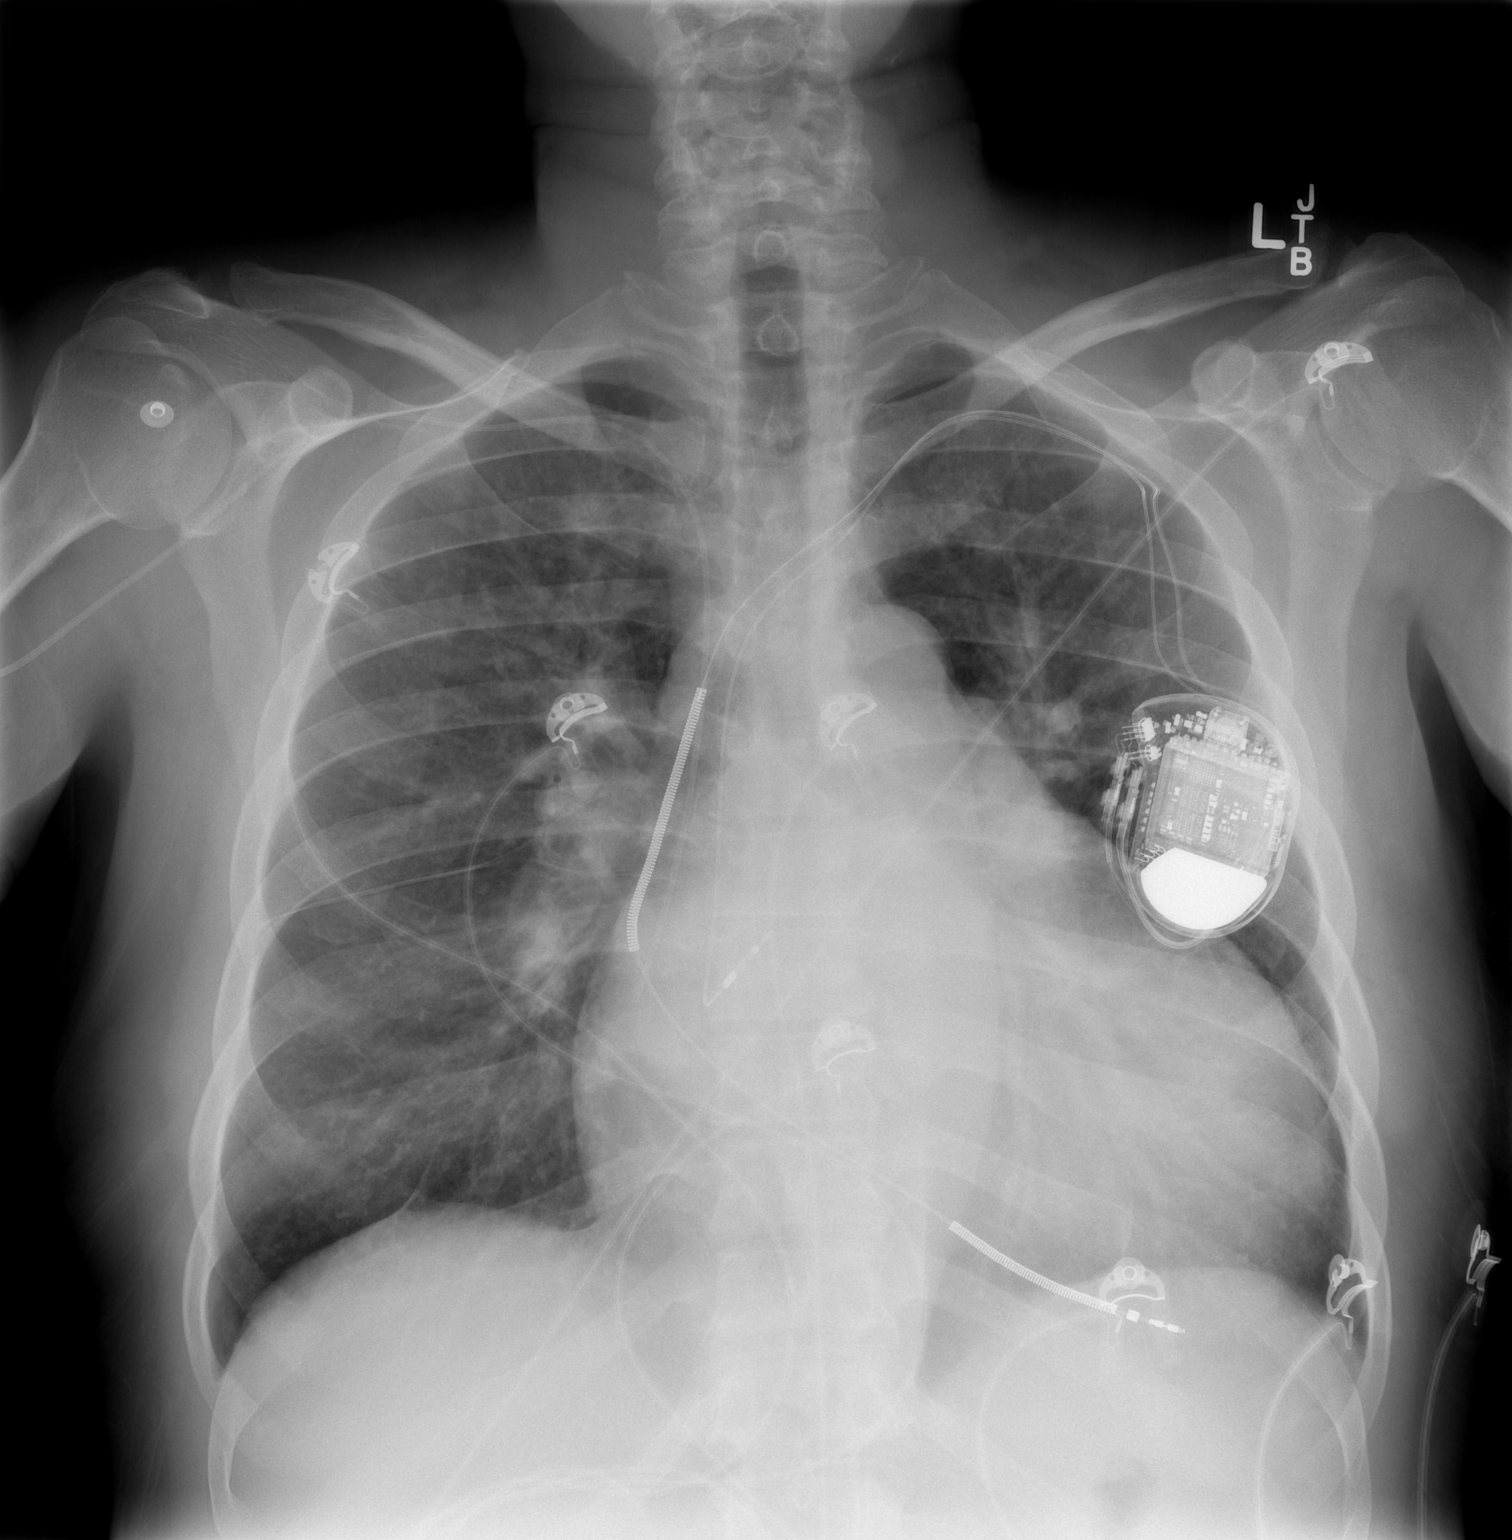

[w chest lat]
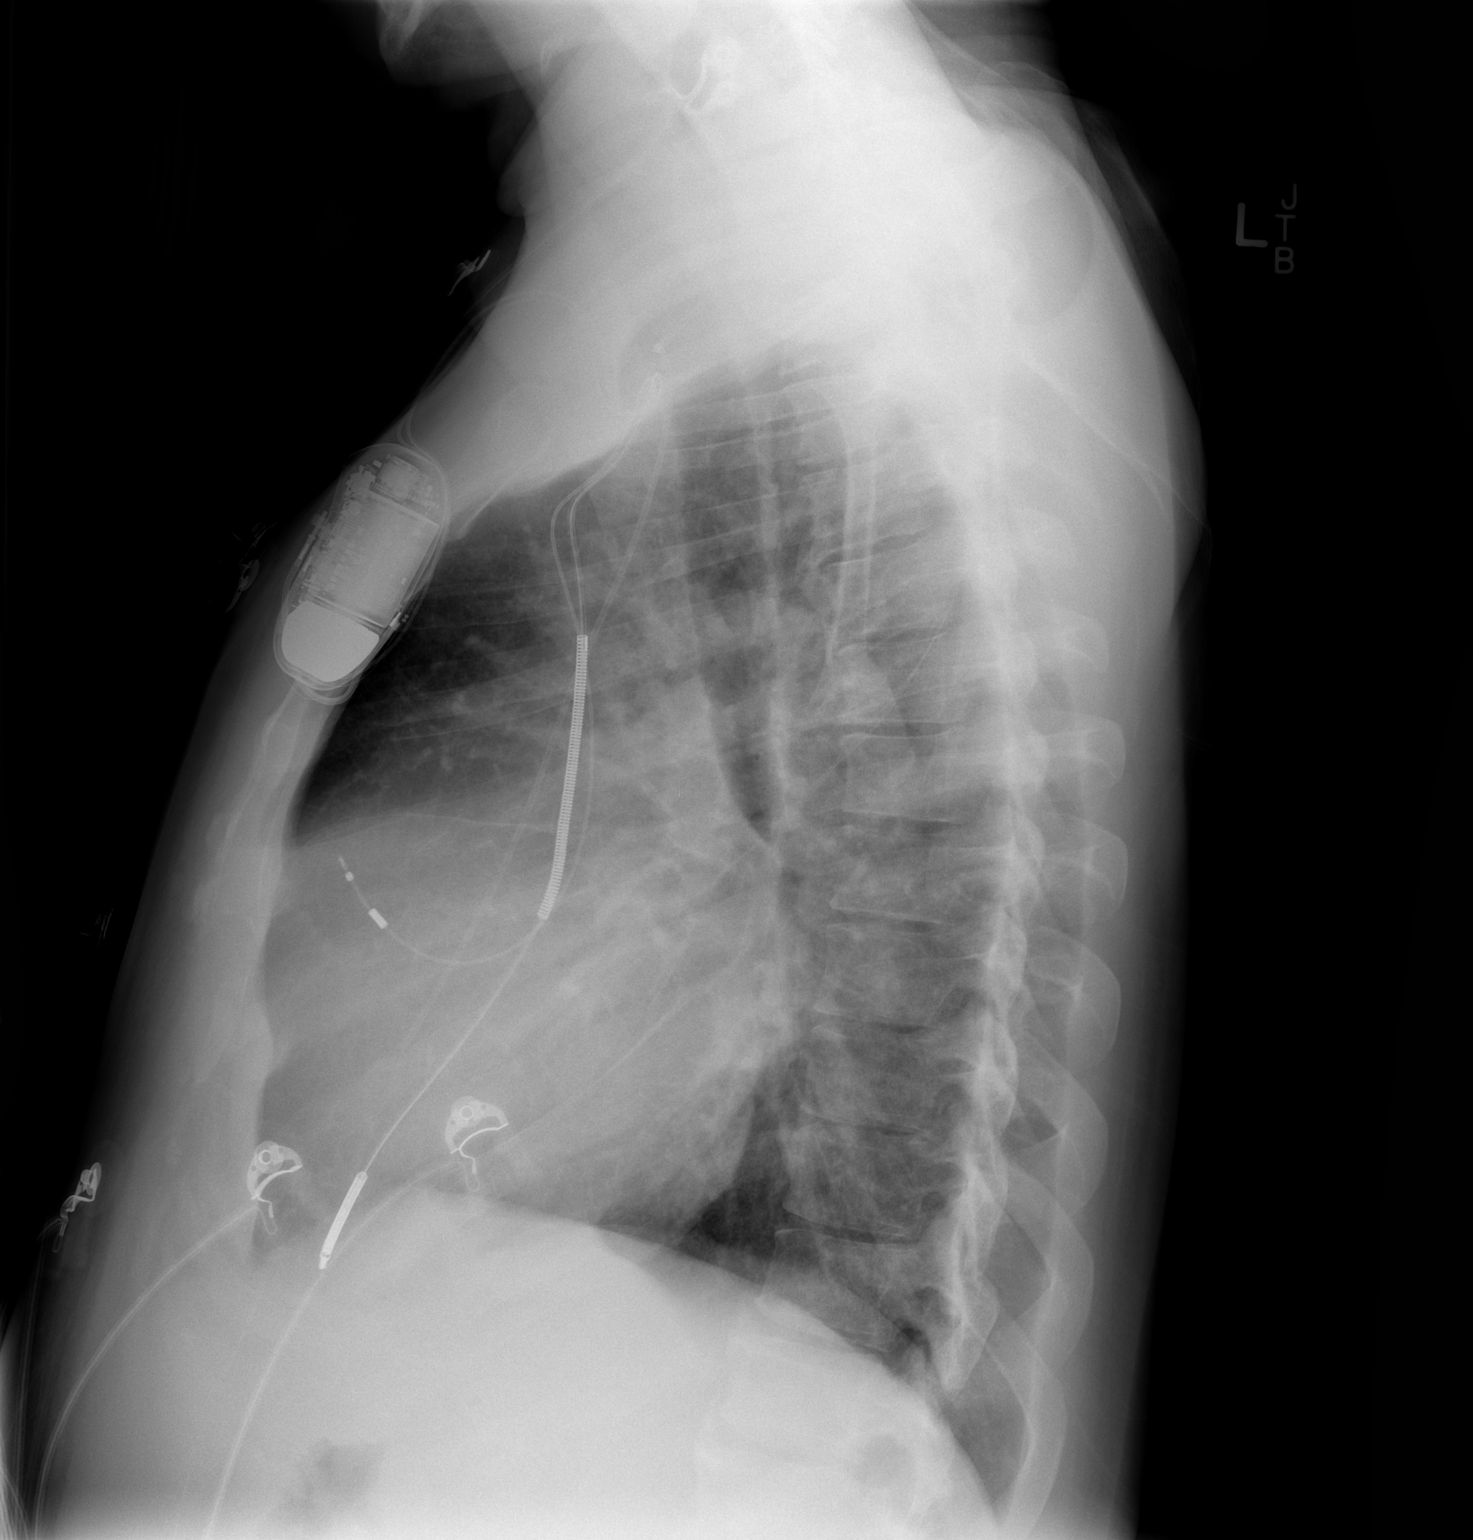

[2 of 2 positions shown; findings below may reference images not displayed]

FINDINGS: The cardiopericardial silhouette remains enlarged. The pulmonary
vascularity remains engorged. The interstitial markings remain
mildly increased bilaterally. There is no alveolar infiltrate or
pleural effusion. The permanent pacemaker defibrillator is
unchanged. The visualized bony thorax is unremarkable.
IMPRESSION: Low-grade CHF with mild interstitial edema.

## 2015-09-28 IMAGING — CR DG CHEST 2V
2 series · 2 of 2 positions shown · non-contrast
Comparison: Chest radiograph performed 05/05/2014

CLINICAL DATA: Chest pain.  History of smoking.

EXAM:
CHEST  2 VIEW

[w chest pa]
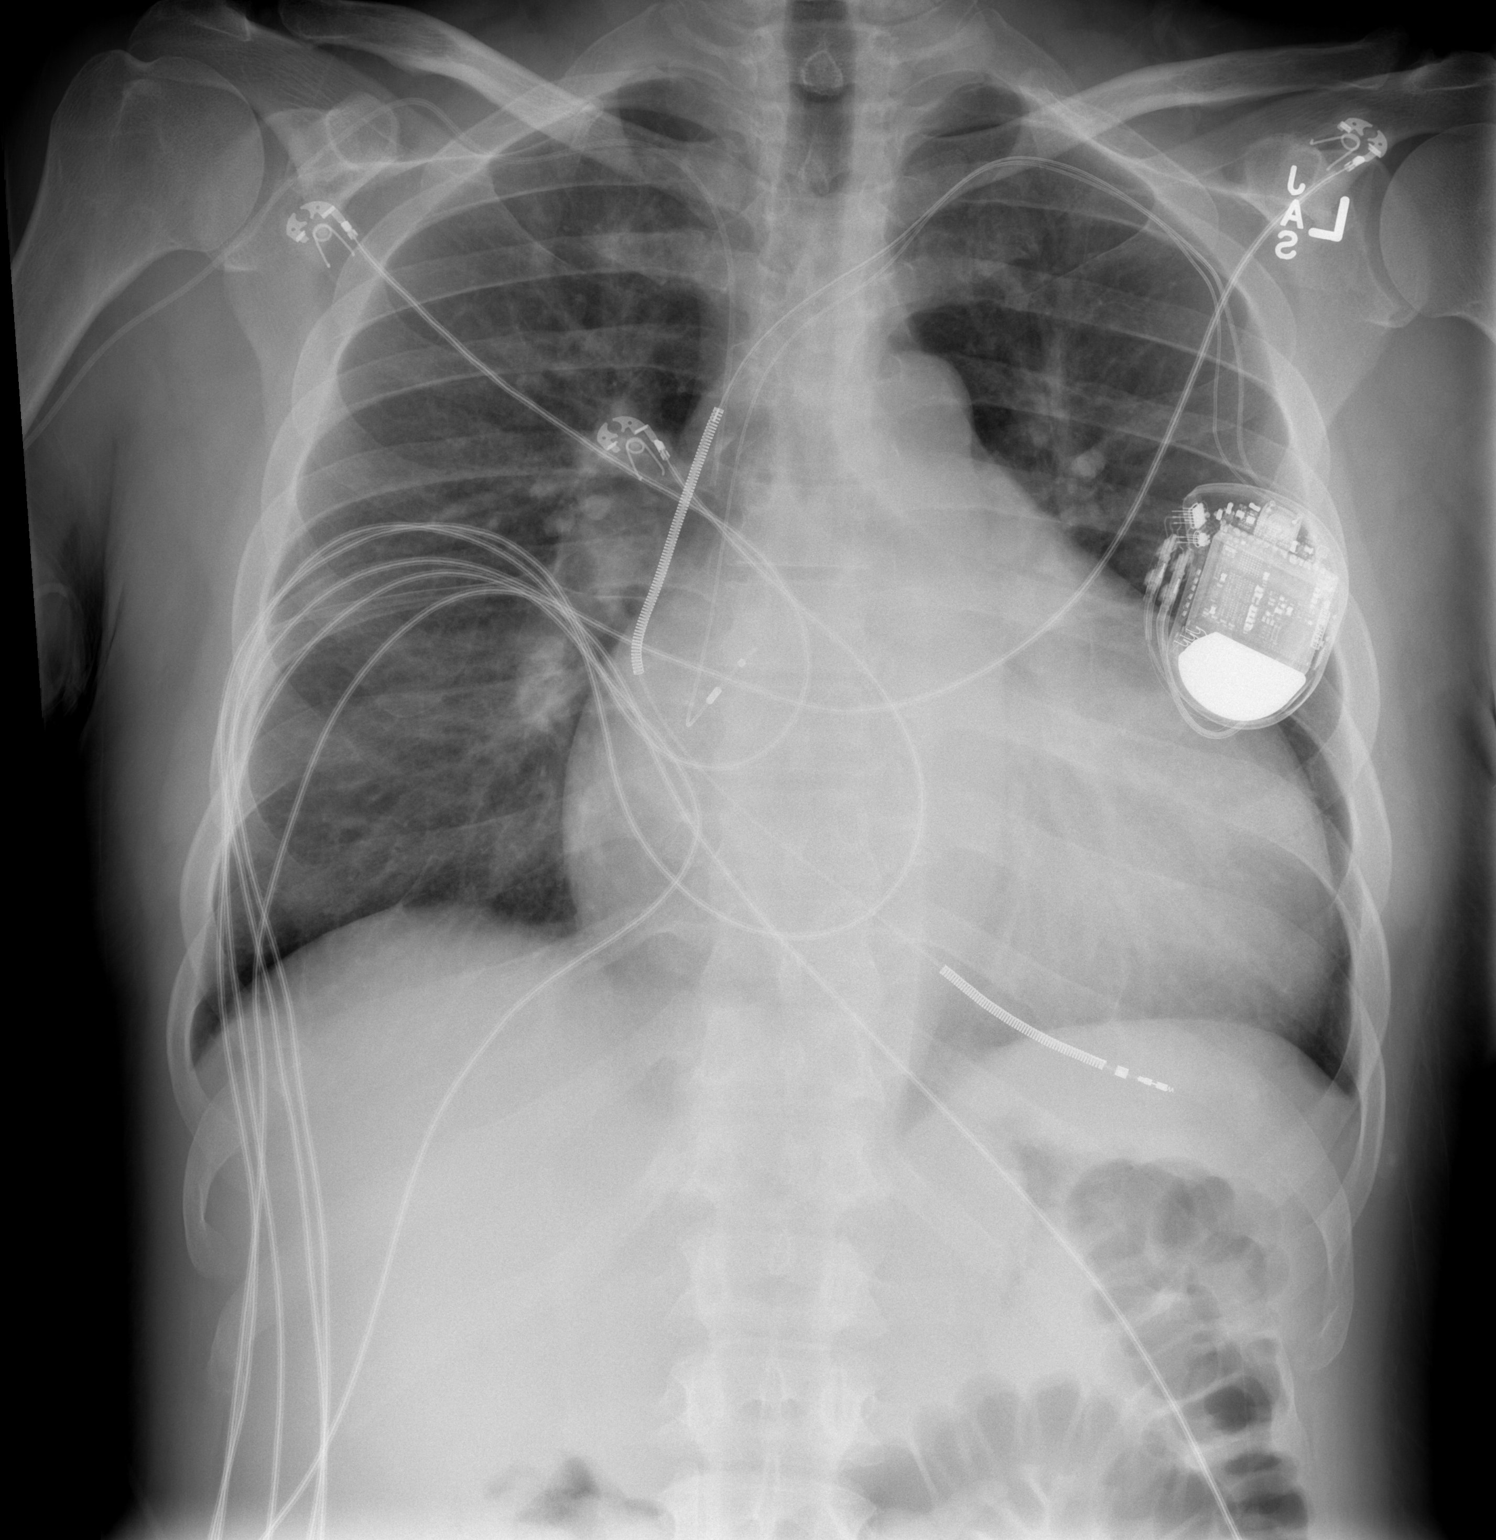

[w chest lat]
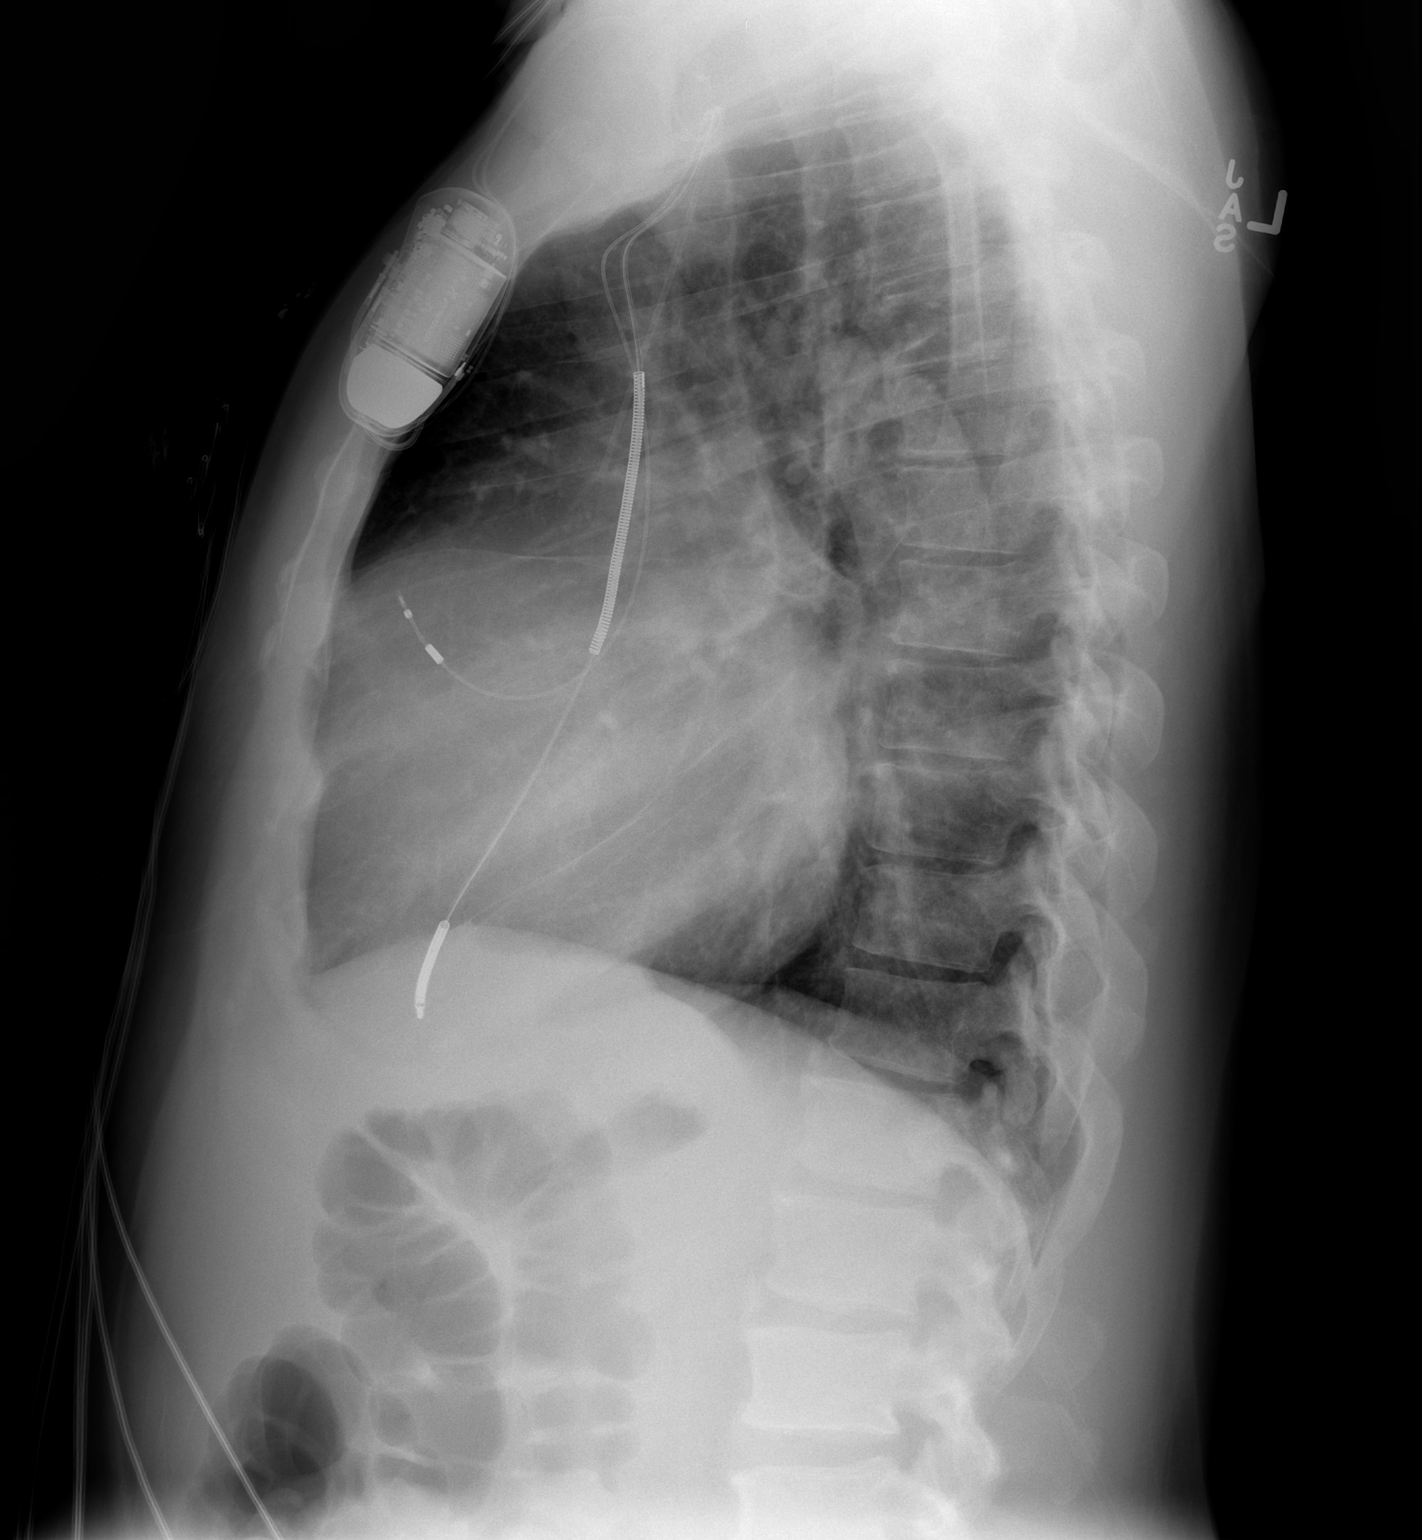

[2 of 2 positions shown; findings below may reference images not displayed]

FINDINGS: The lungs are well-aerated. Pulmonary vascularity is at the upper
limits of normal. There is no evidence of focal opacification,
pleural effusion or pneumothorax.

The heart is enlarged. A pacemaker/AICD is noted at the left chest
wall, with leads ending at the right atrium and right ventricle. No
acute osseous abnormalities are seen.
IMPRESSION: No acute cardiopulmonary process seen.  Cardiomegaly noted.

## 2016-12-15 DEATH — deceased
